# Patient Record
Sex: Female | Born: 1975 | Race: Black or African American | Hispanic: No | Smoking: Never smoker
Health system: Southern US, Community
[De-identification: ages and names within clinical notes are randomized; demographics above are authoritative.]

## PROBLEM LIST (undated history)

## (undated) DIAGNOSIS — R51 Headache: Secondary | ICD-10-CM

## (undated) DIAGNOSIS — Z460 Encounter for fitting and adjustment of spectacles and contact lenses: Secondary | ICD-10-CM

## (undated) DIAGNOSIS — R569 Unspecified convulsions: Secondary | ICD-10-CM

## (undated) DIAGNOSIS — Z9889 Other specified postprocedural states: Secondary | ICD-10-CM

## (undated) DIAGNOSIS — R519 Headache, unspecified: Secondary | ICD-10-CM

## (undated) DIAGNOSIS — Q613 Polycystic kidney, unspecified: Secondary | ICD-10-CM

## (undated) DIAGNOSIS — D649 Anemia, unspecified: Secondary | ICD-10-CM

## (undated) DIAGNOSIS — R011 Cardiac murmur, unspecified: Secondary | ICD-10-CM

## (undated) DIAGNOSIS — Z940 Kidney transplant status: Secondary | ICD-10-CM

## (undated) DIAGNOSIS — E210 Primary hyperparathyroidism: Secondary | ICD-10-CM

## (undated) DIAGNOSIS — I10 Essential (primary) hypertension: Secondary | ICD-10-CM

## (undated) DIAGNOSIS — R112 Nausea with vomiting, unspecified: Secondary | ICD-10-CM

## (undated) DIAGNOSIS — E871 Hypo-osmolality and hyponatremia: Secondary | ICD-10-CM

## (undated) HISTORY — PX: BUNIONECTOMY: SHX129

## (undated) HISTORY — PX: MOUTH SURGERY: SHX715

## (undated) HISTORY — PX: KIDNEY TRANSPLANT: SHX239

## (undated) HISTORY — PX: NEPHRECTOMY: SHX65

---

## 2003-06-04 ENCOUNTER — Other Ambulatory Visit: Admission: RE | Admit: 2003-06-04 | Discharge: 2003-06-04 | Payer: Self-pay | Admitting: Obstetrics and Gynecology

## 2003-08-28 ENCOUNTER — Encounter: Admission: RE | Admit: 2003-08-28 | Discharge: 2003-08-28 | Payer: Self-pay | Admitting: Family Medicine

## 2003-10-02 ENCOUNTER — Ambulatory Visit (HOSPITAL_COMMUNITY): Admission: RE | Admit: 2003-10-02 | Discharge: 2003-10-02 | Payer: Self-pay | Admitting: Nephrology

## 2005-07-08 ENCOUNTER — Ambulatory Visit: Payer: Self-pay | Admitting: Family Medicine

## 2006-04-12 ENCOUNTER — Ambulatory Visit: Payer: Self-pay | Admitting: Family Medicine

## 2006-04-12 LAB — CONVERTED CEMR LAB
ALT: 19 units/L (ref 0–40)
AST: 21 units/L (ref 0–37)
Albumin: 3.9 g/dL (ref 3.5–5.2)
Alkaline Phosphatase: 45 units/L (ref 39–117)
BUN: 14 mg/dL (ref 6–23)
Basophils Absolute: 0 10*3/uL (ref 0.0–0.1)
Basophils Relative: 0.3 % (ref 0.0–1.0)
CO2: 26 meq/L (ref 19–32)
Calcium: 9.3 mg/dL (ref 8.4–10.5)
Chloride: 98 meq/L (ref 96–112)
Creatinine, Ser: 0.8 mg/dL (ref 0.4–1.2)
Eosinophil percent: 1.6 % (ref 0.0–5.0)
Folate: >20 ng/mL
Free T4: 0.7 ng/dL (ref 0.6–1.6)
GFR calc non Af Amer: 89 mL/min
Glomerular Filtration Rate, Af Am: 108 mL/min/{1.73_m2}
Glucose, Bld: 101 mg/dL — ABNORMAL HIGH (ref 70–99)
HCT: 35.4 % — ABNORMAL LOW (ref 36.0–46.0)
Hemoglobin: 11.8 g/dL — ABNORMAL LOW (ref 12.0–15.0)
Lymphocytes Relative: 26.3 % (ref 12.0–46.0)
MCHC: 33.2 g/dL (ref 30.0–36.0)
MCV: 94 fL (ref 78.0–100.0)
Monocytes Absolute: 0.4 10*3/uL (ref 0.2–0.7)
Monocytes Relative: 8.2 % (ref 3.0–11.0)
Neutro Abs: 3 10*3/uL (ref 1.4–7.7)
Neutrophils Relative %: 63.6 % (ref 43.0–77.0)
Platelets: 234 10*3/uL (ref 150–400)
Potassium: 3.4 meq/L — ABNORMAL LOW (ref 3.5–5.1)
RBC: 3.76 M/uL — ABNORMAL LOW (ref 3.87–5.11)
RDW: 12.3 % (ref 11.5–14.6)
Sodium: 133 meq/L — ABNORMAL LOW (ref 135–145)
T3, Free: 2.6 pg/mL (ref 2.3–4.2)
TSH: 1.07 microintl units/mL (ref 0.35–5.50)
Total Bilirubin: 0.6 mg/dL (ref 0.3–1.2)
Total Protein: 6.8 g/dL (ref 6.0–8.3)
Vitamin B-12: 817 pg/mL (ref 211–911)
WBC: 4.7 10*3/uL (ref 4.5–10.5)

## 2006-11-03 ENCOUNTER — Encounter: Payer: Self-pay | Admitting: Family Medicine

## 2007-01-02 ENCOUNTER — Inpatient Hospital Stay (HOSPITAL_COMMUNITY): Admission: AD | Admit: 2007-01-02 | Discharge: 2007-01-05 | Payer: Self-pay | Admitting: Obstetrics and Gynecology

## 2007-01-03 ENCOUNTER — Encounter (INDEPENDENT_AMBULATORY_CARE_PROVIDER_SITE_OTHER): Payer: Self-pay | Admitting: Obstetrics and Gynecology

## 2007-02-02 ENCOUNTER — Encounter: Payer: Self-pay | Admitting: Family Medicine

## 2007-02-21 ENCOUNTER — Encounter: Admission: RE | Admit: 2007-02-21 | Discharge: 2007-02-21 | Payer: Self-pay | Admitting: Nephrology

## 2007-07-31 ENCOUNTER — Encounter: Payer: Self-pay | Admitting: Family Medicine

## 2008-03-07 ENCOUNTER — Encounter: Payer: Self-pay | Admitting: Family Medicine

## 2008-05-22 ENCOUNTER — Ambulatory Visit: Payer: Self-pay | Admitting: Family Medicine

## 2008-05-22 ENCOUNTER — Telehealth (INDEPENDENT_AMBULATORY_CARE_PROVIDER_SITE_OTHER): Payer: Self-pay | Admitting: *Deleted

## 2008-05-22 DIAGNOSIS — J019 Acute sinusitis, unspecified: Secondary | ICD-10-CM | POA: Insufficient documentation

## 2008-09-26 ENCOUNTER — Encounter: Payer: Self-pay | Admitting: Family Medicine

## 2009-04-14 ENCOUNTER — Encounter: Payer: Self-pay | Admitting: Family Medicine

## 2009-10-20 ENCOUNTER — Encounter: Payer: Self-pay | Admitting: Family Medicine

## 2010-02-23 ENCOUNTER — Encounter: Payer: Self-pay | Admitting: Family Medicine

## 2010-03-02 ENCOUNTER — Encounter: Payer: Self-pay | Admitting: Family Medicine

## 2010-05-26 NOTE — Letter (Signed)
Summary: [facility]   Imported By: Edmonia James 05/26/2009 10:50:40  _____________________________________________________________________  External Attachment:    Type:   Image     Comment:   External Document  Appended Document: [facility]    Clinical Lists Changes  Observations: Added new observation of SOCIAL HX: Religion affecting care-- Jehovah witness  (05/26/2009 13:08) Added new observation of RELIGIONIMPT: Religion affecting care (05/26/2009 13:08)          Social History: Religion affecting care-- Jehovah witness

## 2010-05-26 NOTE — Letter (Signed)
Summary: External Correspondence--[facility]  External Correspondence--[facility]   Imported By: Velora Heckler 11/28/2006 11:43:47  _____________________________________________________________________  External Attachment:    Type:   Image     Comment:   External Document

## 2010-05-26 NOTE — Letter (Signed)
Summary: [facility]   Imported By: Edmonia James 11/04/2009 10:04:38  _____________________________________________________________________  External Attachment:    Type:   Image     Comment:   External Document

## 2010-05-26 NOTE — Letter (Signed)
Summary: [facility]  [facility]   Imported By: Velora Heckler 03/24/2007 10:57:40  _____________________________________________________________________  External Attachment:    Type:   Image     Comment:   External Document

## 2010-05-26 NOTE — Progress Notes (Signed)
Summary: diff med - dr Etter Sjogren  Phone Note Call from Patient Call back at Work Phone [phone]   Caller: Patient Summary of Call: patient was given rx for biaxin & Sharlene Dory -she wants to know if there is something less expensive that can be called in   [place] [facility]  Initial call taken by: Carol Spring,  May 22, 2008 1:15 PM  Follow-up for Phone Call        dr lowne pls advise.Felecia Deloach CMA  May 22, 2008 2:03 PM  Additional Follow-up for Phone Call Additional follow up Details #1::        which one?  biaxin is generic.\par Additional Follow-up by: Garnet Koyanagi DO,  May 22, 2008 4:35 PM    Additional Follow-up for Phone Call Additional follow up Details #2::    cough med---- robitussin ac  6 oz 1 tsp by mouth at bedtime as needed  Follow-up by: Garnet Koyanagi DO,  May 23, 2008 12:12 PM  Additional Follow-up for Phone Call Additional follow up Details #3:: Details for Additional Follow-up Action Taken: ledt msg at pt home # rx faxe to [facility] Additional Follow-up by: Verdie Mosher,  May 23, 2008 5:18 PM  New/Updated Medications: ROBITUSSIN COUGH/COLD LONG-ACT 2-15 MG/5ML LIQD (CHLORPHENIRAMINE-DM) 1 tsp by mouth at bedtime   Prescriptions: ROBITUSSIN COUGH/COLD LONG-ACT 2-15 MG/5ML LIQD (CHLORPHENIRAMINE-DM) 1 tsp by mouth at bedtime  #6 oz x 0   Entered by:   Verdie Mosher   Authorized by:   Garnet Koyanagi DO   Signed by:   Verdie Mosher on 05/23/2008   Method used:   Faxed to ...       [facility].* (retail)       [phone]       [place], [place]  [address]       Ph: [phone]       Fax: [phone]   RxID:   [id number]

## 2010-05-26 NOTE — Letter (Signed)
Summary: [facility]   Imported By: Edmonia James 03/23/2010 12:23:55  _____________________________________________________________________  External Attachment:    Type:   Image     Comment:   External Document

## 2010-05-26 NOTE — Letter (Signed)
Summary: [facility]   Imported By: Velora Heckler 08/15/2007 11:57:31  _____________________________________________________________________  External Attachment:    Type:   Image     Comment:   External Document

## 2010-05-26 NOTE — Letter (Signed)
Summary: [facility]   Imported By: Edmonia James 10/16/2008 09:07:45  _____________________________________________________________________  External Attachment:    Type:   Image     Comment:   External Document

## 2010-05-26 NOTE — Assessment & Plan Note (Signed)
Summary: acute congested.cbs   Vital Signs:  Patient Profile:   35 Years Old Female Weight:      152.8 pounds O2 Sat:      100 % Temp:     98.3 degrees F oral Pulse rate:   68 / minute BP sitting:   120 / 82  (left arm)  Pt. in pain?   no  Vitals Entered By: Rolla Flatten CMA (May 22, 2008 11:47 AM)                  Chief Complaint:  cough, vomiting, runny, stuffy nose, and URI symptoms.  History of Present Illness:  URI Symptoms      This is a 35 year old woman who presents with URI symptoms.  The symptoms began duration > 3 days ago.  Pt here c/o congestion and st since the 8th.  The patient reports nasal congestion, purulent nasal discharge, and productive cough, but denies clear nasal discharge, sore throat, dry cough, earache, and sick contacts.  The patient denies fever, low-grade fever (<100.5 degrees), fever of 100.5-103 degrees, fever of 103.1-104 degrees, fever to >104 degrees, stiff neck, dyspnea, wheezing, rash, vomiting, diarrhea, use of an antipyretic, and response to antipyretic.  The patient also reports headache.  The patient denies itchy watery eyes, itchy throat, sneezing, seasonal symptoms, response to antihistamine, muscle aches, and severe fatigue.  The patient denies the following risk factors for Strep sinusitis: unilateral facial pain, unilateral nasal discharge, poor response to decongestant, double sickening, tooth pain, Strep exposure, tender adenopathy, and absence of cough.  Pt tried Robitussin and coricidin with no relief.    Current Allergies (reviewed today): No known allergies      Review of Systems      See HPI   Physical Exam  General:     Well-developed,well-nourished,in no acute distress; alert,appropriate and cooperative throughout examination Ears:     + fluid b/Lno external deformities.   Nose:     L frontal sinus tenderness, L maxillary sinus tenderness, R frontal sinus tenderness, and R maxillary sinus tenderness.     Mouth:     Oral mucosa and oropharynx without lesions or exudates.  Teeth in good repair. Neck:     cervical lymphadenopathy.   Lungs:     Normal respiratory effort, chest expands symmetrically. Lungs are clear to auscultation, no crackles or wheezes. Heart:     normal rate and regular rhythm.   Psych:     Oriented X3, memory intact for recent and remote, and normally interactive.      Impression & Recommendations:  Problem # 1:  SINUSITIS- ACUTE-NOS ([id number])  Her updated medication list for this problem includes:    Biaxin Xl 500 Mg Xr24h-tab (Clarithromycin) .Marland Kitchen... 2 by mouth once daily    Tussionex [place] Er 8-10 Mg/69ml Lqcr (Chlorpheniramine-hydrocodone) .Marland Kitchen... 1 tsp by mouth at bedtime as needed    Nasacort Aq 55 Mcg/act Aers (Triamcinolone acetonide(nasal)) .Marland Kitchen... 2 sprays each nostril once daily Instructed on treatment. Call if symptoms persist or worsen.   Complete Medication List: 1)  Labetalol Hcl 100 Mg Tabs (Labetalol hcl) .... Take 1 tab two times a day 2)  Biaxin Xl 500 Mg Xr24h-tab (Clarithromycin) .... 2 by mouth once daily 3)  Tussionex [place]etic Er 8-10 Mg/55ml Lqcr (Chlorpheniramine-hydrocodone) .Marland Kitchen.. 1 tsp by mouth at bedtime as needed 4)  Nasacort Aq 55 Mcg/act Aers (Triamcinolone acetonide(nasal)) .... 2 sprays each nostril once daily    Prescriptions: NASACORT AQ 55 MCG/ACT AERS (  TRIAMCINOLONE ACETONIDE(NASAL)) 2 sprays each nostril once daily  #1 x 0   Entered and Authorized by:   Garnet Koyanagi DO   Signed by:   Garnet Koyanagi DO on 05/22/2008   Method used:   Print then Give to Patient   RxID:   [id number] [place] ER 8-10 MG/5ML LQCR (CHLORPHENIRAMINE-HYDROCODONE) 1 tsp by mouth at bedtime as needed  #6 oz x 0   Entered and Authorized by:   Garnet Koyanagi DO   Signed by:   Garnet Koyanagi DO on 05/22/2008   Method used:   Print then Give to Patient   RxID:   [id number] BIAXIN XL 500 MG XR24H-TAB (CLARITHROMYCIN) 2 by mouth  once daily  #28 x 0   Entered and Authorized by:   Garnet Koyanagi DO   Signed by:   Garnet Koyanagi DO on 05/22/2008   Method used:   Electronically to        [facility].* (retail)       [phone]       [place], [place]  [address]       Ph: [phone]       Fax: [phone]   RxID:   [id number]

## 2010-05-26 NOTE — Letter (Signed)
Summary: [facility]   Imported By: Edmonia James 03/25/2008 08:57:01  _____________________________________________________________________  External Attachment:    Type:   Image     Comment:   External Document

## 2010-06-18 ENCOUNTER — Encounter: Payer: Self-pay | Admitting: Family Medicine

## 2010-07-02 NOTE — Letter (Signed)
Summary: [facility]   Imported By: Laural Benes 06/26/2010 13:35:14  _____________________________________________________________________  External Attachment:    Type:   Image     Comment:   External Document

## 2011-02-05 LAB — CBC
HCT: 26.8 — ABNORMAL LOW
HCT: 34 — ABNORMAL LOW
Hemoglobin: 11.8 — ABNORMAL LOW
Hemoglobin: 9.5 — ABNORMAL LOW
MCHC: 34.5
MCHC: 35.3
MCV: 92.9
MCV: 94
Platelets: 166
Platelets: 230
RBC: 2.88 — ABNORMAL LOW
RBC: 3.62 — ABNORMAL LOW
RDW: 14.1 — ABNORMAL HIGH
RDW: 14.4 — ABNORMAL HIGH
WBC: 12.4 — ABNORMAL HIGH
WBC: 12.4 — ABNORMAL HIGH

## 2011-02-05 LAB — RPR: RPR Ser Ql: NONREACTIVE

## 2012-04-26 HISTORY — PX: OTHER SURGICAL HISTORY: SHX169

## 2012-09-28 ENCOUNTER — Encounter (HOSPITAL_COMMUNITY): Payer: Self-pay | Admitting: Pharmacy Technician

## 2012-10-04 ENCOUNTER — Encounter (HOSPITAL_COMMUNITY)
Admission: RE | Admit: 2012-10-04 | Discharge: 2012-10-04 | Disposition: A | Discharge status: Home / Self Care | Payer: 59 | Source: Ambulatory Visit | Attending: Obstetrics and Gynecology | Admitting: Obstetrics and Gynecology

## 2012-10-04 ENCOUNTER — Encounter (HOSPITAL_COMMUNITY): Payer: Self-pay

## 2012-10-04 DIAGNOSIS — Z01812 Encounter for preprocedural laboratory examination: Secondary | ICD-10-CM | POA: Insufficient documentation

## 2012-10-04 DIAGNOSIS — Z01818 Encounter for other preprocedural examination: Secondary | ICD-10-CM | POA: Insufficient documentation

## 2012-10-04 HISTORY — DX: Polycystic kidney, unspecified: Q61.3

## 2012-10-04 HISTORY — DX: Essential (primary) hypertension: I10

## 2012-10-04 LAB — CBC
HCT: 31.6 % — ABNORMAL LOW (ref 36.0–46.0)
Hemoglobin: 10.7 g/dL — ABNORMAL LOW (ref 12.0–15.0)
MCH: 30.7 pg (ref 26.0–34.0)
MCHC: 33.9 g/dL (ref 30.0–36.0)
MCV: 90.8 fL (ref 78.0–100.0)
Platelets: 181 10*3/uL (ref 150–400)
RBC: 3.48 MIL/uL — ABNORMAL LOW (ref 3.87–5.11)
RDW: 13 % (ref 11.5–15.5)
WBC: 3.5 10*3/uL — ABNORMAL LOW (ref 4.0–10.5)

## 2012-10-04 LAB — BASIC METABOLIC PANEL
BUN: 9 mg/dL (ref 6–23)
CO2: 25 mEq/L (ref 19–32)
Calcium: 9.4 mg/dL (ref 8.4–10.5)
Chloride: 104 mEq/L (ref 96–112)
Creatinine, Ser: 1.23 mg/dL — ABNORMAL HIGH (ref 0.50–1.10)
GFR calc Af Amer: 64 mL/min — ABNORMAL LOW (ref >90)
GFR calc non Af Amer: 55 mL/min — ABNORMAL LOW (ref >90)
Glucose, Bld: 84 mg/dL (ref 70–99)
Potassium: 3.9 mEq/L (ref 3.5–5.1)
Sodium: 138 mEq/L (ref 135–145)

## 2012-10-04 LAB — SURGICAL PCR SCREEN
MRSA, PCR: INVALID — AB
Staphylococcus aureus: INVALID — AB

## 2012-10-04 NOTE — Patient Instructions (Addendum)
[name]  10/04/2012   Your procedure is scheduled on:  10/12/12  Enter through the Main Entrance of [facility] up the phone at the desk and dial [phone].   Call this number if you have problems the morning of surgery: [phone]   Remember:   Do not eat food:After Midnight.  Do not drink clear liquids: 6 Hours before arrival.  Take these medicines the morning of surgery with A SIP OF WATER: NA   Do not wear jewelry, make-up or nail polish.  Do not wear lotions, powders, or perfumes. You may wear deodorant.  Do not shave 48 hours prior to surgery.  Do not bring valuables to the hospital.  [facility] is not responsible                  for any belongings or valuables brought to the hospital.  Contacts, dentures or bridgework may not be worn into surgery.  Leave suitcase in the car. After surgery it may be brought to your room.  For patients admitted to the hospital, checkout time is 11:00 AM the day of                discharge.   Patients discharged the day of surgery will not be allowed to drive                   home.  Name and phone number of your driver: NA  Special Instructions: Shower using CHG 2 nights before surgery and the night before surgery.  If you shower the day of surgery use CHG.  Use special wash - you have one bottle of CHG for all showers.  You should use approximately 1/3 of the bottle for each shower.   Please read over the following fact sheets that you were given: MRSA Information

## 2012-10-05 ENCOUNTER — Other Ambulatory Visit: Payer: Self-pay | Admitting: Obstetrics and Gynecology

## 2012-10-07 LAB — MRSA CULTURE

## 2012-10-23 ENCOUNTER — Encounter (HOSPITAL_COMMUNITY): Payer: Self-pay | Admitting: Anesthesiology

## 2012-10-23 ENCOUNTER — Ambulatory Visit (HOSPITAL_COMMUNITY)
Admission: RE | Admit: 2012-10-23 | Discharge: 2012-10-23 | Disposition: A | Discharge status: Home / Self Care | Payer: 59 | Source: Ambulatory Visit | Attending: Obstetrics and Gynecology | Admitting: Obstetrics and Gynecology

## 2012-10-23 ENCOUNTER — Encounter (HOSPITAL_COMMUNITY)
Admission: RE | Disposition: A | Discharge status: Home / Self Care | Payer: Self-pay | Source: Ambulatory Visit | Attending: Obstetrics and Gynecology

## 2012-10-23 DIAGNOSIS — N946 Dysmenorrhea, unspecified: Secondary | ICD-10-CM | POA: Insufficient documentation

## 2012-10-23 DIAGNOSIS — N920 Excessive and frequent menstruation with regular cycle: Secondary | ICD-10-CM | POA: Insufficient documentation

## 2012-10-23 DIAGNOSIS — N803 Endometriosis of pelvic peritoneum, unspecified: Secondary | ICD-10-CM

## 2012-10-23 HISTORY — PX: ROBOTIC ASSISTED LAPAROSCOPIC LYSIS OF ADHESION: SHX6080

## 2012-10-23 HISTORY — PX: HYSTEROSCOPY WITH D & C: SHX1775

## 2012-10-23 LAB — BASIC METABOLIC PANEL
BUN: 12 mg/dL (ref 6–23)
CO2: 27 mEq/L (ref 19–32)
Calcium: 8.8 mg/dL (ref 8.4–10.5)
Chloride: 101 mEq/L (ref 96–112)
Creatinine, Ser: 1.12 mg/dL — ABNORMAL HIGH (ref 0.50–1.10)
GFR calc Af Amer: 71 mL/min — ABNORMAL LOW (ref >90)
GFR calc non Af Amer: 61 mL/min — ABNORMAL LOW (ref >90)
Glucose, Bld: 85 mg/dL (ref 70–99)
Potassium: 3.6 mEq/L (ref 3.5–5.1)
Sodium: 137 mEq/L (ref 135–145)

## 2012-10-23 SURGERY — ROBOTIC ASSISTED LAPAROSCOPIC LYSIS OF ADHESION
Anesthesia: General | Site: Vagina | Wound class: Clean Contaminated

## 2012-10-23 MED ORDER — NEOSTIGMINE METHYLSULFATE 1 MG/ML IJ SOLN
INTRAMUSCULAR | Status: AC
  Filled 2012-10-23: qty 1

## 2012-10-23 MED ORDER — DEXAMETHASONE SODIUM PHOSPHATE 10 MG/ML IJ SOLN
INTRAMUSCULAR | Status: AC
  Filled 2012-10-23: qty 1

## 2012-10-23 MED ORDER — CHLOROPROCAINE HCL 1 % IJ SOLN
INTRAMUSCULAR | Status: DC | PRN
  Administered 2012-10-23: 20 mL

## 2012-10-23 MED ORDER — OXYCODONE-ACETAMINOPHEN 5-325 MG PO TABS: 1.0000 | ORAL_TABLET | ORAL | Status: DC | PRN

## 2012-10-23 MED ORDER — LIDOCAINE HCL (CARDIAC) 20 MG/ML IV SOLN
INTRAVENOUS | Status: AC
  Filled 2012-10-23: qty 5

## 2012-10-23 MED ORDER — GLYCOPYRROLATE 0.2 MG/ML IJ SOLN
INTRAMUSCULAR | Status: DC | PRN
  Administered 2012-10-23: 0.4 mg via INTRAVENOUS

## 2012-10-23 MED ORDER — ROPIVACAINE HCL 5 MG/ML IJ SOLN
INTRAMUSCULAR | Status: AC
  Filled 2012-10-23: qty 60

## 2012-10-23 MED ORDER — DEXAMETHASONE SODIUM PHOSPHATE 4 MG/ML IJ SOLN
INTRAMUSCULAR | Status: DC | PRN
  Administered 2012-10-23: 10 mg via INTRAVENOUS

## 2012-10-23 MED ORDER — MIDAZOLAM HCL 5 MG/5ML IJ SOLN
INTRAMUSCULAR | Status: DC | PRN
  Administered 2012-10-23: 2 mg via INTRAVENOUS

## 2012-10-23 MED ORDER — ONDANSETRON HCL 4 MG/2ML IJ SOLN
INTRAMUSCULAR | Status: DC | PRN
  Administered 2012-10-23: 4 mg via INTRAVENOUS

## 2012-10-23 MED ORDER — LIDOCAINE HCL (CARDIAC) 20 MG/ML IV SOLN
INTRAVENOUS | Status: DC | PRN
  Administered 2012-10-23: 50 mg via INTRAVENOUS

## 2012-10-23 MED ORDER — ACETAMINOPHEN 10 MG/ML IV SOLN
1000.0000 mg | Freq: Once | INTRAVENOUS | Status: AC
  Administered 2012-10-23: 1000 mg via INTRAVENOUS

## 2012-10-23 MED ORDER — OXYCODONE-ACETAMINOPHEN 5-325 MG PO TABS
1.0000 | ORAL_TABLET | Freq: Once | ORAL | Status: AC
  Administered 2012-10-23: 1 via ORAL

## 2012-10-23 MED ORDER — ONDANSETRON HCL 4 MG/2ML IJ SOLN
INTRAMUSCULAR | Status: AC
  Filled 2012-10-23: qty 2

## 2012-10-23 MED ORDER — ACETAMINOPHEN 10 MG/ML IV SOLN
INTRAVENOUS | Status: AC
  Filled 2012-10-23: qty 100

## 2012-10-23 MED ORDER — LACTATED RINGERS IV SOLN
INTRAVENOUS | Status: DC
  Administered 2012-10-23 (×3): via INTRAVENOUS

## 2012-10-23 MED ORDER — LACTATED RINGERS IR SOLN
Status: DC | PRN
  Administered 2012-10-23: 3000 mL

## 2012-10-23 MED ORDER — GLYCOPYRROLATE 0.2 MG/ML IJ SOLN
INTRAMUSCULAR | Status: AC
  Filled 2012-10-23: qty 2

## 2012-10-23 MED ORDER — BUPIVACAINE HCL (PF) 0.25 % IJ SOLN
INTRAMUSCULAR | Status: DC | PRN
  Administered 2012-10-23: 9 mL

## 2012-10-23 MED ORDER — FENTANYL CITRATE 0.05 MG/ML IJ SOLN
INTRAMUSCULAR | Status: DC | PRN
  Administered 2012-10-23: 50 ug via INTRAVENOUS
  Administered 2012-10-23: 150 ug via INTRAVENOUS
  Administered 2012-10-23: 50 ug via INTRAVENOUS

## 2012-10-23 MED ORDER — NEOSTIGMINE METHYLSULFATE 1 MG/ML IJ SOLN
INTRAMUSCULAR | Status: DC | PRN
  Administered 2012-10-23: 3 mg via INTRAVENOUS

## 2012-10-23 MED ORDER — FENTANYL CITRATE 0.05 MG/ML IJ SOLN
INTRAMUSCULAR | Status: AC
  Filled 2012-10-23: qty 5

## 2012-10-23 MED ORDER — PROPOFOL 10 MG/ML IV BOLUS
INTRAVENOUS | Status: DC | PRN
  Administered 2012-10-23: 100 mg via INTRAVENOUS

## 2012-10-23 MED ORDER — GLYCINE 1.5 % IR SOLN
Status: DC | PRN
  Administered 2012-10-23: 3000 mL

## 2012-10-23 MED ORDER — ROPIVACAINE HCL 5 MG/ML IJ SOLN
INTRAMUSCULAR | Status: DC | PRN
  Administered 2012-10-23: 60 mL

## 2012-10-23 MED ORDER — FENTANYL CITRATE 0.05 MG/ML IJ SOLN
25.0000 ug | INTRAMUSCULAR | Status: DC | PRN
  Administered 2012-10-23: 50 ug via INTRAVENOUS

## 2012-10-23 MED ORDER — ARTIFICIAL TEARS OP OINT
TOPICAL_OINTMENT | OPHTHALMIC | Status: AC
  Filled 2012-10-23: qty 3.5

## 2012-10-23 MED ORDER — OXYCODONE-ACETAMINOPHEN 5-325 MG PO TABS
ORAL_TABLET | ORAL | Status: AC
  Filled 2012-10-23: qty 1

## 2012-10-23 MED ORDER — BUPIVACAINE HCL (PF) 0.25 % IJ SOLN
INTRAMUSCULAR | Status: AC
  Filled 2012-10-23: qty 30

## 2012-10-23 MED ORDER — PROPOFOL 10 MG/ML IV EMUL
INTRAVENOUS | Status: AC
  Filled 2012-10-23: qty 20

## 2012-10-23 MED ORDER — CHLOROPROCAINE HCL 1 % IJ SOLN
INTRAMUSCULAR | Status: AC
  Filled 2012-10-23: qty 30

## 2012-10-23 MED ORDER — MIDAZOLAM HCL 2 MG/2ML IJ SOLN
INTRAMUSCULAR | Status: AC
  Filled 2012-10-23: qty 2

## 2012-10-23 MED ORDER — FENTANYL CITRATE 0.05 MG/ML IJ SOLN
INTRAMUSCULAR | Status: AC
  Administered 2012-10-23: 50 ug via INTRAVENOUS
  Filled 2012-10-23: qty 2

## 2012-10-23 MED ORDER — CISATRACURIUM BESYLATE (PF) 10 MG/5ML IV SOLN
INTRAVENOUS | Status: DC | PRN
  Administered 2012-10-23: 10 mg via INTRAVENOUS
  Administered 2012-10-23: 2 mg via INTRAVENOUS

## 2012-10-23 SURGICAL SUPPLY — 69 items
BAG URINE DRAINAGE (UROLOGICAL SUPPLIES) IMPLANT
BARRIER ADHS 3X4 INTERCEED (GAUZE/BANDAGES/DRESSINGS) ×4 IMPLANT
CANISTER SUCTION 2500CC (MISCELLANEOUS) ×4 IMPLANT
CATH FOLEY 3WAY  5CC 16FR (CATHETERS)
CATH FOLEY 3WAY 5CC 16FR (CATHETERS) IMPLANT
CATH ROBINSON RED A/P 16FR (CATHETERS) IMPLANT
CHLORAPREP W/TINT 26ML (MISCELLANEOUS) ×4 IMPLANT
CLOTH BEACON ORANGE TIMEOUT ST (SAFETY) ×4 IMPLANT
CONT PATH 16OZ SNAP LID 3702 (MISCELLANEOUS) ×4 IMPLANT
CONTAINER PREFILL 10% NBF 60ML (FORM) ×4 IMPLANT
COVER MAYO STAND STRL (DRAPES) ×4 IMPLANT
COVER TABLE BACK 60X90 (DRAPES) ×4 IMPLANT
COVER TIP SHEARS 8 DVNC (MISCELLANEOUS) ×3 IMPLANT
COVER TIP SHEARS 8MM DA VINCI (MISCELLANEOUS) ×1
DECANTER SPIKE VIAL GLASS SM (MISCELLANEOUS) ×8 IMPLANT
DERMABOND ADVANCED (GAUZE/BANDAGES/DRESSINGS) ×1
DERMABOND ADVANCED .7 DNX12 (GAUZE/BANDAGES/DRESSINGS) ×3 IMPLANT
DEVICE TROCAR PUNCTURE CLOSURE (ENDOMECHANICALS) IMPLANT
DRAPE HUG U DISPOSABLE (DRAPE) ×4 IMPLANT
DRAPE LG THREE QUARTER DISP (DRAPES) ×4 IMPLANT
DRAPE WARM FLUID 44X44 (DRAPE) ×4 IMPLANT
DRESSING TELFA 8X3 (GAUZE/BANDAGES/DRESSINGS) ×12 IMPLANT
ELECT REM PT RETURN 9FT ADLT (ELECTROSURGICAL) ×4
ELECTRODE REM PT RTRN 9FT ADLT (ELECTROSURGICAL) ×3 IMPLANT
ELECTRODE ROLLER VERSAPOINT (ELECTRODE) IMPLANT
ELECTRODE RT ANGLE VERSAPOINT (CUTTING LOOP) IMPLANT
EVACUATOR SMOKE 8.L (FILTER) ×4 IMPLANT
GAUZE VASELINE 3X9 (GAUZE/BANDAGES/DRESSINGS) IMPLANT
GLOVE BIO SURGEON STRL SZ 6.5 (GLOVE) ×4 IMPLANT
GLOVE BIOGEL PI IND STRL 7.0 (GLOVE) ×6 IMPLANT
GLOVE BIOGEL PI INDICATOR 7.0 (GLOVE) ×2
GOWN STRL REIN XL XLG (GOWN DISPOSABLE) ×32 IMPLANT
KIT ACCESSORY DA VINCI DISP (KITS) ×1
KIT ACCESSORY DVNC DISP (KITS) ×3 IMPLANT
LEGGING LITHOTOMY PAIR STRL (DRAPES) IMPLANT
LOOP ANGLED CUTTING 22FR (CUTTING LOOP) IMPLANT
NEEDLE INSUFFLATION 120MM (ENDOMECHANICALS) ×4 IMPLANT
NS IRRIG 1000ML POUR BTL (IV SOLUTION) IMPLANT
OCCLUDER COLPOPNEUMO (BALLOONS) IMPLANT
PACK HYSTEROSCOPY LF (CUSTOM PROCEDURE TRAY) ×4 IMPLANT
PACK LAVH (CUSTOM PROCEDURE TRAY) ×4 IMPLANT
PAD OB MATERNITY 4.3X12.25 (PERSONAL CARE ITEMS) ×4 IMPLANT
PAD PREP 24X48 CUFFED NSTRL (MISCELLANEOUS) ×8 IMPLANT
PLUG CATH AND CAP STER (CATHETERS) IMPLANT
PROTECTOR NERVE ULNAR (MISCELLANEOUS) ×8 IMPLANT
SCISSORS LAP 5X35 DISP (ENDOMECHANICALS) IMPLANT
SET CYSTO W/LG BORE CLAMP LF (SET/KITS/TRAYS/PACK) IMPLANT
SET IRRIG TUBING LAPAROSCOPIC (IRRIGATION / IRRIGATOR) ×4 IMPLANT
SOLUTION ELECTROLUBE (MISCELLANEOUS) ×4 IMPLANT
SUT VIC AB 0 CT1 27 (SUTURE) ×2
SUT VIC AB 0 CT1 27XBRD ANTBC (SUTURE) ×6 IMPLANT
SUT VICRYL 0 UR6 27IN ABS (SUTURE) ×4 IMPLANT
SUT VICRYL 4-0 PS2 18IN ABS (SUTURE) ×8 IMPLANT
SYR 50ML LL SCALE MARK (SYRINGE) ×4 IMPLANT
SYSTEM CONVERTIBLE TROCAR (TROCAR) IMPLANT
TIP UTERINE 5.1X6CM LAV DISP (MISCELLANEOUS) IMPLANT
TIP UTERINE 6.7X10CM GRN DISP (MISCELLANEOUS) IMPLANT
TIP UTERINE 6.7X6CM WHT DISP (MISCELLANEOUS) IMPLANT
TIP UTERINE 6.7X8CM BLUE DISP (MISCELLANEOUS) IMPLANT
TOWEL OR 17X24 6PK STRL BLUE (TOWEL DISPOSABLE) ×12 IMPLANT
TRAY FOLEY BAG SILVER LF 14FR (CATHETERS) ×4 IMPLANT
TROCAR 12M 150ML BLUNT (TROCAR) IMPLANT
TROCAR DISP BLADELESS 8 DVNC (TROCAR) ×3 IMPLANT
TROCAR DISP BLADELESS 8MM (TROCAR) ×1
TROCAR OPTI TIP 12M 100M (ENDOMECHANICALS) ×4 IMPLANT
TROCAR Z-THREAD 12X150 (TROCAR) ×4 IMPLANT
TUBING FILTER THERMOFLATOR (ELECTROSURGICAL) ×4 IMPLANT
WARMER LAPAROSCOPE (MISCELLANEOUS) ×4 IMPLANT
WATER STERILE IRR 1000ML POUR (IV SOLUTION) ×12 IMPLANT

## 2012-10-23 NOTE — Anesthesia Procedure Notes (Signed)
Procedure Name: Intubation Date/Time: 10/23/2012 12:33 PM Performed by: Jonna Munro Pre-anesthesia Checklist: Suction available, Emergency Drugs available, Timeout performed, Patient identified and Patient being monitored Patient Re-evaluated:Patient Re-evaluated prior to inductionOxygen Delivery Method: Circle system utilized Preoxygenation: Pre-oxygenation with 100% oxygen Intubation Type: IV induction Ventilation: Mask ventilation without difficulty Laryngoscope Size: Mac and 3 Grade View: Grade I Tube type: Oral Tube size: 7.0 mm Number of attempts: 1 Airway Equipment and Method: Stylet Placement Confirmation: ETT inserted through vocal cords under direct vision,  positive ETCO2,  CO2 detector and breath sounds checked- equal and bilateral Secured at: 22 cm Tube secured with: Tape Dental Injury: Teeth and Oropharynx as per pre-operative assessment

## 2012-10-23 NOTE — Transfer of Care (Signed)
Immediate Anesthesia Transfer of Care Note  Patient: Sharon [name]  Procedure(s) Performed: Procedure(s): ROBOTIC ASSISTED  excision OF stage 2 pelvic ENDOMETRIOSIS (N/A) DILATATION AND CURETTAGE / Diagnostic HYSTEROSCOPY (N/A)  Patient Location: PACU  Anesthesia Type:General  Level of Consciousness: awake, alert  and oriented  Airway & Oxygen Therapy: Patient Spontanous Breathing and Patient connected to nasal cannula oxygen  Post-op Assessment: Report given to PACU RN and Post -op Vital signs reviewed and stable  Post vital signs: Reviewed and stable  Complications: No apparent anesthesia complications

## 2012-10-23 NOTE — Anesthesia Preprocedure Evaluation (Signed)
Anesthesia Evaluation  Patient identified by MRN, date of birth, ID band Patient awake    Reviewed: Allergy & Precautions, H&P , NPO status , Patient's Chart, lab work & pertinent test results, reviewed documented beta blocker date and time   History of Anesthesia Complications Negative for: history of anesthetic complications  Airway Mallampati: I TM Distance: >3 FB Neck ROM: full    Dental  (+) Teeth Intact   Pulmonary neg pulmonary ROS,  breath sounds clear to auscultation  Pulmonary exam normal       Cardiovascular Exercise Tolerance: Good hypertension (lisinopril, takes at night, BP 159/85), On Medications Rhythm:regular Rate:Normal     Neuro/Psych negative neurological ROS  negative psych ROS   GI/Hepatic negative GI ROS, Neg liver ROS,   Endo/Other  negative endocrine ROS  Renal/GU Renal disease (polycystic kidney disease, good kidney function)  Female GU complaint     Musculoskeletal   Abdominal   Peds  Hematology negative hematology ROS (+)   Anesthesia Other Findings NO TORADOL - kidney disease  Reproductive/Obstetrics negative OB ROS                           Anesthesia Physical Anesthesia Plan  ASA: II  Anesthesia Plan: General ETT   Post-op Pain Management:    Induction:   Airway Management Planned:   Additional Equipment:   Intra-op Plan:   Post-operative Plan:   Informed Consent: I have reviewed the patients History and Physical, chart, labs and discussed the procedure including the risks, benefits and alternatives for the proposed anesthesia with the patient or authorized representative who has indicated his/her understanding and acceptance.   Dental Advisory Given  Plan Discussed with: CRNA and Surgeon  Anesthesia Plan Comments:         Anesthesia Quick Evaluation

## 2012-10-23 NOTE — Brief Op Note (Signed)
10/23/2012  3:09 PM  PATIENT:  Sharon [name]  37 y.o. female  PRE-OPERATIVE DIAGNOSIS:  Dysmenorrhea, Menorrhagia, Endometrial Mass    POST-OPERATIVE DIAGNOSIS:  Dysmenorrhea, Menorrhagia, Stage II pelvic endometriosis  PROCEDURE:  Procedure(s): ROBOTIC ASSISTED  excision OF stage 2 pelvic ENDOMETRIOSIS (N/A) DILATATION AND CURETTAGE / Diagnostic HYSTEROSCOPY (N/A)  SURGEON:  Surgeon(s) and Role:    * Marvene Staff, MD - Primary      PHYSICIAN ASSISTANT:   ASSISTANTS: Brien Few, MD   ANESTHESIA:   general and paracervical block FINDINGS; endometriosis post cul de sac( infilitrating) and superficial, right ovarian fossa, nl tubes and ovaries, RF uterus ,nl liver edge, uterus w/ small uterine fibroid EBL:  Total I/O In: 1400 [I.V.:1400] Out: 325 [Urine:300; Blood:25]  BLOOD ADMINISTERED:none  DRAINS: none   LOCAL MEDICATIONS USED:  MARCAINE    and OTHER robuvicaine  SPECIMEN:  Source of Specimen:  emc, post cul desac endometriosis, right ovarian fossa endometrosis  DISPOSITION OF SPECIMEN:  PATHOLOGY  COUNTS:  YES  TOURNIQUET:  * No tourniquets in log *  DICTATION: .Other Dictation: Dictation Number [id number]  PLAN OF CARE: Discharge to home after PACU  PATIENT DISPOSITION:  PACU - hemodynamically stable.   Delay start of Pharmacological VTE agent (>24hrs) due to surgical blood loss or risk of bleeding: no

## 2012-10-24 ENCOUNTER — Encounter (HOSPITAL_COMMUNITY): Payer: Self-pay | Admitting: Obstetrics and Gynecology

## 2012-10-24 NOTE — Anesthesia Postprocedure Evaluation (Signed)
  Anesthesia Post-op Note  Patient: Sharon [name]  Procedure(s) Performed: Procedure(s): ROBOTIC ASSISTED  excision OF stage 2 pelvic ENDOMETRIOSIS (N/A) DILATATION AND CURETTAGE / Diagnostic HYSTEROSCOPY (N/A) Patient is awake and responsive. Pain and nausea are reasonably well controlled. Vital signs are stable and clinically acceptable. Oxygen saturation is clinically acceptable. There are no apparent anesthetic complications at this time. Patient is ready for discharge.

## 2012-10-24 NOTE — Op Note (Signed)
NAMEOLUWATENIOLA, LUTMAN              ACCOUNT NO.:  [id number]  MEDICAL RECORD NO.:  [id number]  LOCATION:  [facility]                          FACILITY:  [facility]  PHYSICIAN:  Servando Salina, M.D.DATE OF BIRTH:  1975-06-05  DATE OF PROCEDURE:  10/23/2012 DATE OF DISCHARGE:  10/23/2012                              OPERATIVE REPORT   PREOPERATIVE DIAGNOSES:  Severe dysmenorrhea, endometrial mass, menorrhagia.  PROCEDURE:  Da Vinci robotic excision of stage II pelvic endometriosis, diagnostic hysteroscopy, dilation and curettage.  POSTOPERATIVE DIAGNOSES:  Stage II pelvic endometriosis, menorrhagia.  ANESTHESIA:  General.  SURGEON:  Servando Salina, MD.  ASSISTANT:  Lovenia Kim, MD., robotic surgery.  PROCEDURE IN DETAIL:  Under adequate general anesthesia, the patient was placed in the dorsal lithotomy position.  She was positioned for potential robotic surgery.  Examination under anesthesia revealed a retroverted, bulky uterus.  No adnexal masses could be appreciated.  The patient was sterilely prepped and draped in usual fashion.  Indwelling Foley catheter was sterilely placed.  A bivalve speculum was placed in vagina.  Single-tooth tenaculum was placed on the anterior lip of the cervix.  The cervix was parous.  The cervix was easily accepted with 27 Pratt dilator.  A glycine primed diagnostic hysteroscope was introduced into the uterine cavity without incident.  Both tubal ostia could be seen.  Other than slight fluffiness, no specific lesion was noted.  The hysteroscope was removed.  The cavity was then curetted with moderate amount of tissue.  Hysteroscope was reinserted.  No other lesions were noted.  At that point, an acorn cannula was introduced into the cervical os and attached tenaculum for manipulation of the uterus.  The bivalve speculum was then removed, I then went to the patient's abdomen where 0.25% Marcaine was injected infraumbilically.  An incision was then  made.  Veress needle was introduced and tested, 3 L of CO2 was insufflated.  A 12-mm disposable trocar with sleeve was introduced into the abdomen without incident.  The robotic camera port was then placed. Inspection initially revealed some endometriotic implant in the fundus. On raising the uterus, it was noted that there was endometriosis in the posterior cul-de-sac, which resulted in the decision to proceed with the robotic instruments.  An 8-mm robotic ports were placed in the right lower quadrant as well as the left lower quadrant.  A 5-mm assistant port was placed in the right upper quadrant. The robot was then docked, and arm #1  monopolar scissors and arm #2 PK dissector.  I then went to the surgical consult.  At the surgical consult, the pelvis was further inspected.  Both ureters were identified.  Both ovaries were noted to be normal, tubes were normal, uterus had some evidence of some small fibroids posteriorly.  There was superficial and deep infiltrating endometriosis particularly in the posterior cul-de-sac, the right ovarian fossa, and less so on the left ovarian fossa.  With careful dissection, the deep infiltrating lesions were then excised from the posterior cul-de-sac, the right ovarian fossa, and minute endometrial implants were then cauterized on the ovary.  Once the resections were done, the abdomen was irrigated and suctioned.  Interceed was placed overlying the raw surface  of the posterior cul-de-sac and ropivacaine was then local anesthesia was then injected in the pelvis.  The procedure was felt to be completed at that point.  The robotic instruments were removed.  The robot was undocked.  I then went back to the patient's bedside sterilely and continued to inspect the pelvis.  Good hemostasis noted.  At which time, the instruments were then removed under direct visualization.  The instruments in the vagina was also removed.  The abdomen was deflated and the  incisions were closed, 0 Vicryl figure-of-eight sutures at the fascia infraumbilically and the skin approximated with 4-0 Vicryl subcuticular stitches.  SPECIMENS:  Endometrial curettings, and posterior cul-de-sac endometriosis as right ovarian fossa endometriosis, all sent to Pathology.  ESTIMATED BLOOD LOSS:  30 mL.  INTRAOPERATIVE FLUIDS:  2 L. The patient tolerated the procedure well, was transferred to recovery in stable condition.     Servando Salina, M.D.     Celina/MEDQ  D:  10/23/2012  T:  10/24/2012  Job:  [id number]

## 2013-03-01 ENCOUNTER — Other Ambulatory Visit: Payer: Self-pay

## 2016-04-26 HISTORY — PX: HERNIA REPAIR: SHX51

## 2016-06-08 ENCOUNTER — Other Ambulatory Visit (HOSPITAL_COMMUNITY): Payer: Self-pay | Admitting: *Deleted

## 2016-06-09 ENCOUNTER — Encounter (HOSPITAL_COMMUNITY)
Admission: RE | Admit: 2016-06-09 | Discharge: 2016-06-09 | Disposition: A | Discharge status: Home / Self Care | Payer: 59 | Source: Ambulatory Visit | Attending: Nephrology | Admitting: Nephrology

## 2016-06-09 DIAGNOSIS — D631 Anemia in chronic kidney disease: Secondary | ICD-10-CM | POA: Insufficient documentation

## 2016-06-09 DIAGNOSIS — N184 Chronic kidney disease, stage 4 (severe): Secondary | ICD-10-CM | POA: Diagnosis not present

## 2016-06-09 LAB — POCT HEMOGLOBIN-HEMACUE: Hemoglobin: 9.6 g/dL — ABNORMAL LOW (ref 12.0–15.0)

## 2016-06-09 MED ORDER — EPOETIN ALFA 20000 UNIT/ML IJ SOLN: 30000.0000 [IU] | INTRAMUSCULAR | Status: DC

## 2016-06-09 MED ORDER — EPOETIN ALFA 20000 UNIT/ML IJ SOLN
INTRAMUSCULAR | Status: AC
  Administered 2016-06-09: 20000 [IU] via SUBCUTANEOUS
  Filled 2016-06-09: qty 1

## 2016-06-09 MED ORDER — EPOETIN ALFA 10000 UNIT/ML IJ SOLN
INTRAMUSCULAR | Status: AC
  Administered 2016-06-09: 10000 [IU] via SUBCUTANEOUS
  Filled 2016-06-09: qty 1

## 2016-06-09 NOTE — Discharge Instructions (Signed)
Epoetin Alfa injection °What is this medicine? °EPOETIN ALFA (e POE e tin AL fa) helps your body make more red blood cells. This medicine is used to treat anemia caused by chronic kidney failure, cancer chemotherapy, or HIV-therapy. It may also be used before surgery if you have anemia. °This medicine may be used for other purposes; ask your health care provider or pharmacist if you have questions. °COMMON BRAND NAME(S): Epogen, Procrit °What should I tell my health care provider before I take this medicine? °They need to know if you have any of these conditions: °-blood clotting disorders °-cancer patient not on chemotherapy °-cystic fibrosis °-heart disease, such as angina or heart failure °-hemoglobin level of 12 g/dL or greater °-high blood pressure °-low levels of folate, iron, or vitamin B12 °-seizures °-an unusual or allergic reaction to erythropoietin, albumin, benzyl alcohol, hamster proteins, other medicines, foods, dyes, or preservatives °-pregnant or trying to get pregnant °-breast-feeding °How should I use this medicine? °This medicine is for injection into a vein or under the skin. It is usually given by a health care professional in a hospital or clinic setting. °If you get this medicine at home, you will be taught how to prepare and give this medicine. Use exactly as directed. Take your medicine at regular intervals. Do not take your medicine more often than directed. °It is important that you put your used needles and syringes in a special sharps container. Do not put them in a trash can. If you do not have a sharps container, call your pharmacist or healthcare provider to get one. °A special MedGuide will be given to you by the pharmacist with each prescription and refill. Be sure to read this information carefully each time. °Talk to your pediatrician regarding the use of this medicine in children. While this drug may be prescribed for selected conditions, precautions do apply. °Overdosage: If you  think you have taken too much of this medicine contact a poison control center or emergency room at once. °NOTE: This medicine is only for you. Do not share this medicine with others. °What if I miss a dose? °If you miss a dose, take it as soon as you can. If it is almost time for your next dose, take only that dose. Do not take double or extra doses. °What may interact with this medicine? °Do not take this medicine with any of the following medications: °-darbepoetin alfa °This list may not describe all possible interactions. Give your health care provider a list of all the medicines, herbs, non-prescription drugs, or dietary supplements you use. Also tell them if you smoke, drink alcohol, or use illegal drugs. Some items may interact with your medicine. °What should I watch for while using this medicine? °Your condition will be monitored carefully while you are receiving this medicine. °You may need blood work done while you are taking this medicine. °What side effects may I notice from receiving this medicine? °Side effects that you should report to your doctor or health care professional as soon as possible: °-allergic reactions like skin rash, itching or hives, swelling of the face, lips, or tongue °-breathing problems °-changes in vision °-chest pain °-confusion, trouble speaking or understanding °-feeling faint or lightheaded, falls °-high blood pressure °-muscle aches or pains °-pain, swelling, warmth in the leg °-rapid weight gain °-severe headaches °-sudden numbness or weakness of the face, arm or leg °-trouble walking, dizziness, loss of balance or coordination °-seizures (convulsions) °-swelling of the ankles, feet, hands °-unusually weak or tired °  Side effects that usually do not require medical attention (report to your doctor or health care professional if they continue or are bothersome): °-diarrhea °-fever, chills (flu-like symptoms) °-headaches °-nausea, vomiting °-redness, stinging, or swelling at  site where injected °This list may not describe all possible side effects. Call your doctor for medical advice about side effects. You may report side effects to FDA at 1-800-FDA-1088. °Where should I keep my medicine? °Keep out of the reach of children. °Store in a refrigerator between 2 and 8 degrees C (36 and 46 degrees F). Do not freeze or shake. Throw away any unused portion if using a single-dose vial. Multi-dose vials can be kept in the refrigerator for up to 21 days after the initial dose. Throw away unused medicine. °NOTE: This sheet is a summary. It may not cover all possible information. If you have questions about this medicine, talk to your doctor, pharmacist, or health care provider. °© 2017 [employer]vier/Gold Standard (2015-12-01 19:42:31) ° °

## 2016-06-24 ENCOUNTER — Encounter (HOSPITAL_COMMUNITY): Payer: PRIVATE HEALTH INSURANCE

## 2016-07-01 ENCOUNTER — Encounter (HOSPITAL_COMMUNITY)
Admission: RE | Admit: 2016-07-01 | Discharge: 2016-07-01 | Disposition: A | Discharge status: Home / Self Care | Payer: 59 | Source: Ambulatory Visit | Attending: Nephrology | Admitting: Nephrology

## 2016-07-01 DIAGNOSIS — N184 Chronic kidney disease, stage 4 (severe): Secondary | ICD-10-CM | POA: Insufficient documentation

## 2016-07-01 DIAGNOSIS — D631 Anemia in chronic kidney disease: Secondary | ICD-10-CM | POA: Diagnosis not present

## 2016-07-01 LAB — POCT HEMOGLOBIN-HEMACUE: Hemoglobin: 10.2 g/dL — ABNORMAL LOW (ref 12.0–15.0)

## 2016-07-01 MED ORDER — EPOETIN ALFA 10000 UNIT/ML IJ SOLN
INTRAMUSCULAR | Status: AC
  Administered 2016-07-01: 10000 [IU] via SUBCUTANEOUS
  Filled 2016-07-01: qty 1

## 2016-07-01 MED ORDER — EPOETIN ALFA 20000 UNIT/ML IJ SOLN: 30000.0000 [IU] | INTRAMUSCULAR | Status: DC

## 2016-07-01 MED ORDER — EPOETIN ALFA 20000 UNIT/ML IJ SOLN
INTRAMUSCULAR | Status: AC
  Administered 2016-07-01: 20000 [IU] via SUBCUTANEOUS
  Filled 2016-07-01: qty 1

## 2016-07-21 ENCOUNTER — Other Ambulatory Visit (HOSPITAL_COMMUNITY): Payer: Self-pay | Admitting: *Deleted

## 2016-07-22 ENCOUNTER — Encounter (HOSPITAL_COMMUNITY)
Admission: RE | Admit: 2016-07-22 | Discharge: 2016-07-22 | Disposition: A | Discharge status: Home / Self Care | Payer: 59 | Source: Ambulatory Visit | Attending: Nephrology | Admitting: Nephrology

## 2016-07-22 DIAGNOSIS — N184 Chronic kidney disease, stage 4 (severe): Secondary | ICD-10-CM | POA: Diagnosis not present

## 2016-07-22 LAB — FERRITIN: Ferritin: 20 ng/mL (ref 11–307)

## 2016-07-22 LAB — IRON AND TIBC
Iron: 77 ug/dL (ref 28–170)
Saturation Ratios: 22 % (ref 10.4–31.8)
TIBC: 356 ug/dL (ref 250–450)
UIBC: 279 ug/dL

## 2016-07-22 LAB — POCT HEMOGLOBIN-HEMACUE: Hemoglobin: 10.2 g/dL — ABNORMAL LOW (ref 12.0–15.0)

## 2016-07-22 MED ORDER — EPOETIN ALFA 10000 UNIT/ML IJ SOLN: 30000.0000 [IU] | INTRAMUSCULAR | Status: DC

## 2016-07-22 MED ORDER — EPOETIN ALFA 10000 UNIT/ML IJ SOLN
INTRAMUSCULAR | Status: AC
  Administered 2016-07-22: 10000 [IU] via SUBCUTANEOUS
  Filled 2016-07-22: qty 1

## 2016-07-22 MED ORDER — EPOETIN ALFA 20000 UNIT/ML IJ SOLN
INTRAMUSCULAR | Status: AC
  Administered 2016-07-22: 20000 [IU] via SUBCUTANEOUS
  Filled 2016-07-22: qty 1

## 2016-07-26 ENCOUNTER — Encounter: Payer: Self-pay | Admitting: Nephrology

## 2016-07-26 DIAGNOSIS — Z862 Personal history of diseases of the blood and blood-forming organs and certain disorders involving the immune mechanism: Secondary | ICD-10-CM | POA: Insufficient documentation

## 2016-07-26 DIAGNOSIS — N189 Chronic kidney disease, unspecified: Secondary | ICD-10-CM | POA: Insufficient documentation

## 2016-08-05 ENCOUNTER — Encounter (HOSPITAL_COMMUNITY): Payer: PRIVATE HEALTH INSURANCE

## 2016-08-11 ENCOUNTER — Other Ambulatory Visit (HOSPITAL_COMMUNITY): Payer: Self-pay | Admitting: *Deleted

## 2016-08-12 ENCOUNTER — Encounter (HOSPITAL_COMMUNITY)
Admission: RE | Admit: 2016-08-12 | Discharge: 2016-08-12 | Disposition: A | Discharge status: Home / Self Care | Payer: 59 | Source: Ambulatory Visit | Attending: Nephrology | Admitting: Nephrology

## 2016-08-12 DIAGNOSIS — D631 Anemia in chronic kidney disease: Secondary | ICD-10-CM | POA: Diagnosis not present

## 2016-08-12 DIAGNOSIS — N184 Chronic kidney disease, stage 4 (severe): Secondary | ICD-10-CM | POA: Diagnosis present

## 2016-08-12 DIAGNOSIS — Z862 Personal history of diseases of the blood and blood-forming organs and certain disorders involving the immune mechanism: Secondary | ICD-10-CM

## 2016-08-12 DIAGNOSIS — N189 Chronic kidney disease, unspecified: Secondary | ICD-10-CM

## 2016-08-12 LAB — POCT HEMOGLOBIN-HEMACUE: Hemoglobin: 9.9 g/dL — ABNORMAL LOW (ref 12.0–15.0)

## 2016-08-12 MED ORDER — SODIUM CHLORIDE 0.9 % IV SOLN
510.0000 mg | Freq: Once | INTRAVENOUS | Status: AC
  Administered 2016-08-12: 510 mg via INTRAVENOUS
  Filled 2016-08-12: qty 17

## 2016-08-12 MED ORDER — EPOETIN ALFA 10000 UNIT/ML IJ SOLN
INTRAMUSCULAR | Status: AC
  Administered 2016-08-12: 10000 [IU]
  Filled 2016-08-12: qty 1

## 2016-08-12 MED ORDER — EPOETIN ALFA 40000 UNIT/ML IJ SOLN: 30000.0000 [IU] | INTRAMUSCULAR | Status: DC

## 2016-08-12 MED ORDER — EPOETIN ALFA 20000 UNIT/ML IJ SOLN
INTRAMUSCULAR | Status: AC
  Administered 2016-08-12: 20000 [IU]
  Filled 2016-08-12: qty 1

## 2016-09-03 ENCOUNTER — Encounter (HOSPITAL_COMMUNITY)
Admission: RE | Admit: 2016-09-03 | Discharge: 2016-09-03 | Disposition: A | Discharge status: Home / Self Care | Payer: 59 | Source: Ambulatory Visit | Attending: Nephrology | Admitting: Nephrology

## 2016-09-03 DIAGNOSIS — N184 Chronic kidney disease, stage 4 (severe): Secondary | ICD-10-CM | POA: Insufficient documentation

## 2016-09-03 DIAGNOSIS — D631 Anemia in chronic kidney disease: Secondary | ICD-10-CM | POA: Insufficient documentation

## 2016-09-03 DIAGNOSIS — Z862 Personal history of diseases of the blood and blood-forming organs and certain disorders involving the immune mechanism: Secondary | ICD-10-CM

## 2016-09-03 DIAGNOSIS — N189 Chronic kidney disease, unspecified: Secondary | ICD-10-CM

## 2016-09-03 LAB — IRON AND TIBC
Iron: 121 ug/dL (ref 28–170)
Saturation Ratios: 45 % — ABNORMAL HIGH (ref 10.4–31.8)
TIBC: 267 ug/dL (ref 250–450)
UIBC: 146 ug/dL

## 2016-09-03 LAB — POCT HEMOGLOBIN-HEMACUE: Hemoglobin: 10.9 g/dL — ABNORMAL LOW (ref 12.0–15.0)

## 2016-09-03 LAB — FERRITIN: Ferritin: 194 ng/mL (ref 11–307)

## 2016-09-03 MED ORDER — EPOETIN ALFA 40000 UNIT/ML IJ SOLN: 30000.0000 [IU] | INTRAMUSCULAR | Status: DC

## 2016-09-03 MED ORDER — EPOETIN ALFA 10000 UNIT/ML IJ SOLN
INTRAMUSCULAR | Status: AC
  Administered 2016-09-03: 10000 [IU] via SUBCUTANEOUS
  Filled 2016-09-03: qty 1

## 2016-09-03 MED ORDER — EPOETIN ALFA 20000 UNIT/ML IJ SOLN
INTRAMUSCULAR | Status: AC
  Administered 2016-09-03: 20000 [IU] via SUBCUTANEOUS
  Filled 2016-09-03: qty 1

## 2016-09-24 ENCOUNTER — Encounter (HOSPITAL_COMMUNITY)
Admission: RE | Admit: 2016-09-24 | Discharge: 2016-09-24 | Disposition: A | Discharge status: Home / Self Care | Payer: 59 | Source: Ambulatory Visit | Attending: Nephrology | Admitting: Nephrology

## 2016-09-24 DIAGNOSIS — N184 Chronic kidney disease, stage 4 (severe): Secondary | ICD-10-CM | POA: Insufficient documentation

## 2016-09-24 DIAGNOSIS — D631 Anemia in chronic kidney disease: Secondary | ICD-10-CM | POA: Diagnosis not present

## 2016-09-24 DIAGNOSIS — Z862 Personal history of diseases of the blood and blood-forming organs and certain disorders involving the immune mechanism: Secondary | ICD-10-CM

## 2016-09-24 DIAGNOSIS — N189 Chronic kidney disease, unspecified: Secondary | ICD-10-CM

## 2016-09-24 LAB — POCT HEMOGLOBIN-HEMACUE: Hemoglobin: 11.3 g/dL — ABNORMAL LOW (ref 12.0–15.0)

## 2016-09-24 MED ORDER — EPOETIN ALFA 20000 UNIT/ML IJ SOLN
INTRAMUSCULAR | Status: AC
  Administered 2016-09-24: 20000 [IU] via SUBCUTANEOUS
  Filled 2016-09-24: qty 1

## 2016-09-24 MED ORDER — EPOETIN ALFA 40000 UNIT/ML IJ SOLN: 30000.0000 [IU] | INTRAMUSCULAR | Status: DC

## 2016-09-24 MED ORDER — EPOETIN ALFA 10000 UNIT/ML IJ SOLN
INTRAMUSCULAR | Status: AC
  Administered 2016-09-24: 10000 [IU] via SUBCUTANEOUS
  Filled 2016-09-24: qty 1

## 2016-10-15 ENCOUNTER — Encounter (HOSPITAL_COMMUNITY)
Admission: RE | Admit: 2016-10-15 | Discharge: 2016-10-15 | Disposition: A | Discharge status: Home / Self Care | Payer: 59 | Source: Ambulatory Visit | Attending: Nephrology | Admitting: Nephrology

## 2016-10-15 DIAGNOSIS — N184 Chronic kidney disease, stage 4 (severe): Secondary | ICD-10-CM | POA: Diagnosis not present

## 2016-10-15 DIAGNOSIS — Z862 Personal history of diseases of the blood and blood-forming organs and certain disorders involving the immune mechanism: Secondary | ICD-10-CM

## 2016-10-15 DIAGNOSIS — N189 Chronic kidney disease, unspecified: Secondary | ICD-10-CM

## 2016-10-15 LAB — FERRITIN: Ferritin: 91 ng/mL (ref 11–307)

## 2016-10-15 LAB — POCT HEMOGLOBIN-HEMACUE: Hemoglobin: 11.9 g/dL — ABNORMAL LOW (ref 12.0–15.0)

## 2016-10-15 LAB — IRON AND TIBC
Iron: 98 ug/dL (ref 28–170)
Saturation Ratios: 32 % — ABNORMAL HIGH (ref 10.4–31.8)
TIBC: 304 ug/dL (ref 250–450)
UIBC: 206 ug/dL

## 2016-10-15 MED ORDER — EPOETIN ALFA 20000 UNIT/ML IJ SOLN
INTRAMUSCULAR | Status: AC
  Administered 2016-10-15: 20000 [IU] via SUBCUTANEOUS
  Filled 2016-10-15: qty 1

## 2016-10-15 MED ORDER — EPOETIN ALFA 10000 UNIT/ML IJ SOLN
INTRAMUSCULAR | Status: AC
  Administered 2016-10-15: 10000 [IU] via SUBCUTANEOUS
  Filled 2016-10-15: qty 1

## 2016-10-15 MED ORDER — EPOETIN ALFA 40000 UNIT/ML IJ SOLN: 30000.0000 [IU] | INTRAMUSCULAR | Status: DC

## 2016-11-05 ENCOUNTER — Encounter (HOSPITAL_COMMUNITY)
Admission: RE | Admit: 2016-11-05 | Discharge: 2016-11-05 | Disposition: A | Discharge status: Home / Self Care | Payer: 59 | Source: Ambulatory Visit | Attending: Nephrology | Admitting: Nephrology

## 2016-11-05 DIAGNOSIS — N189 Chronic kidney disease, unspecified: Secondary | ICD-10-CM

## 2016-11-05 DIAGNOSIS — N184 Chronic kidney disease, stage 4 (severe): Secondary | ICD-10-CM | POA: Insufficient documentation

## 2016-11-05 DIAGNOSIS — D631 Anemia in chronic kidney disease: Secondary | ICD-10-CM | POA: Diagnosis not present

## 2016-11-05 DIAGNOSIS — Z862 Personal history of diseases of the blood and blood-forming organs and certain disorders involving the immune mechanism: Secondary | ICD-10-CM

## 2016-11-05 LAB — POCT HEMOGLOBIN-HEMACUE: Hemoglobin: 11.6 g/dL — ABNORMAL LOW (ref 12.0–15.0)

## 2016-11-05 MED ORDER — EPOETIN ALFA 20000 UNIT/ML IJ SOLN
INTRAMUSCULAR | Status: AC
  Administered 2016-11-05: 20000 [IU] via SUBCUTANEOUS
  Filled 2016-11-05: qty 1

## 2016-11-05 MED ORDER — EPOETIN ALFA 40000 UNIT/ML IJ SOLN: 30000.0000 [IU] | INTRAMUSCULAR | Status: DC

## 2016-11-05 MED ORDER — EPOETIN ALFA 10000 UNIT/ML IJ SOLN
INTRAMUSCULAR | Status: AC
  Administered 2016-11-05: 10000 [IU] via SUBCUTANEOUS
  Filled 2016-11-05: qty 1

## 2016-11-29 ENCOUNTER — Encounter (HOSPITAL_COMMUNITY)
Admission: RE | Admit: 2016-11-29 | Discharge: 2016-11-29 | Disposition: A | Discharge status: Home / Self Care | Payer: 59 | Source: Ambulatory Visit | Attending: Nephrology | Admitting: Nephrology

## 2016-11-29 DIAGNOSIS — D631 Anemia in chronic kidney disease: Secondary | ICD-10-CM | POA: Insufficient documentation

## 2016-11-29 DIAGNOSIS — N184 Chronic kidney disease, stage 4 (severe): Secondary | ICD-10-CM | POA: Diagnosis not present

## 2016-11-29 DIAGNOSIS — Z862 Personal history of diseases of the blood and blood-forming organs and certain disorders involving the immune mechanism: Secondary | ICD-10-CM

## 2016-11-29 DIAGNOSIS — N189 Chronic kidney disease, unspecified: Secondary | ICD-10-CM

## 2016-11-29 LAB — IRON AND TIBC
Iron: 121 ug/dL (ref 28–170)
Saturation Ratios: 43 % — ABNORMAL HIGH (ref 10.4–31.8)
TIBC: 280 ug/dL (ref 250–450)
UIBC: 159 ug/dL

## 2016-11-29 LAB — FERRITIN: Ferritin: 79 ng/mL (ref 11–307)

## 2016-11-29 LAB — POCT HEMOGLOBIN-HEMACUE: Hemoglobin: 11.3 g/dL — ABNORMAL LOW (ref 12.0–15.0)

## 2016-11-29 MED ORDER — EPOETIN ALFA 20000 UNIT/ML IJ SOLN
INTRAMUSCULAR | Status: AC
  Administered 2016-11-29: 20000 [IU] via SUBCUTANEOUS
  Filled 2016-11-29: qty 1

## 2016-11-29 MED ORDER — EPOETIN ALFA 40000 UNIT/ML IJ SOLN: 30000.0000 [IU] | INTRAMUSCULAR | Status: DC

## 2016-11-29 MED ORDER — EPOETIN ALFA 10000 UNIT/ML IJ SOLN
INTRAMUSCULAR | Status: AC
  Administered 2016-11-29: 10000 [IU] via SUBCUTANEOUS
  Filled 2016-11-29: qty 1

## 2016-12-24 ENCOUNTER — Encounter (HOSPITAL_COMMUNITY): Payer: PRIVATE HEALTH INSURANCE

## 2017-08-30 ENCOUNTER — Other Ambulatory Visit (HOSPITAL_COMMUNITY): Payer: Self-pay | Admitting: *Deleted

## 2017-08-31 ENCOUNTER — Encounter (HOSPITAL_COMMUNITY)
Admission: RE | Admit: 2017-08-31 | Discharge: 2017-08-31 | Disposition: A | Discharge status: Home / Self Care | Payer: PRIVATE HEALTH INSURANCE | Source: Ambulatory Visit | Attending: Nephrology | Admitting: Nephrology

## 2017-08-31 DIAGNOSIS — N189 Chronic kidney disease, unspecified: Secondary | ICD-10-CM | POA: Insufficient documentation

## 2017-08-31 DIAGNOSIS — D631 Anemia in chronic kidney disease: Secondary | ICD-10-CM | POA: Insufficient documentation

## 2017-08-31 MED ORDER — SODIUM CHLORIDE 0.9 % IV SOLN
510.0000 mg | Freq: Once | INTRAVENOUS | Status: AC
  Administered 2017-08-31: 510 mg via INTRAVENOUS
  Filled 2017-08-31: qty 17

## 2017-11-18 ENCOUNTER — Other Ambulatory Visit: Payer: Self-pay

## 2017-11-18 DIAGNOSIS — N185 Chronic kidney disease, stage 5: Secondary | ICD-10-CM

## 2017-12-20 ENCOUNTER — Ambulatory Visit (INDEPENDENT_AMBULATORY_CARE_PROVIDER_SITE_OTHER)
Admission: RE | Admit: 2017-12-20 | Discharge: 2017-12-20 | Disposition: A | Discharge status: Home / Self Care | Payer: PRIVATE HEALTH INSURANCE | Source: Ambulatory Visit | Attending: Vascular Surgery | Admitting: Vascular Surgery

## 2017-12-20 ENCOUNTER — Encounter: Payer: Self-pay | Admitting: Vascular Surgery

## 2017-12-20 ENCOUNTER — Other Ambulatory Visit: Payer: Self-pay

## 2017-12-20 ENCOUNTER — Ambulatory Visit (INDEPENDENT_AMBULATORY_CARE_PROVIDER_SITE_OTHER): Payer: PRIVATE HEALTH INSURANCE | Admitting: Vascular Surgery

## 2017-12-20 ENCOUNTER — Ambulatory Visit (HOSPITAL_COMMUNITY)
Admission: RE | Admit: 2017-12-20 | Discharge: 2017-12-20 | Disposition: A | Discharge status: Home / Self Care | Payer: PRIVATE HEALTH INSURANCE | Source: Ambulatory Visit | Attending: Vascular Surgery | Admitting: Vascular Surgery

## 2017-12-20 VITALS — BP 125/79 | HR 72 | Temp 97.7°F | Resp 16 | Ht 68.0 in | Wt 164.0 lb

## 2017-12-20 DIAGNOSIS — N185 Chronic kidney disease, stage 5: Secondary | ICD-10-CM | POA: Diagnosis not present

## 2017-12-20 NOTE — Progress Notes (Signed)
[facility]ist of [facility]  Patient name: Sharon [name] MRN: [id number] DOB: 07-27-1975 Sex: female  REASON FOR CONSULT: Discuss access for hemodialysis  HPI: Sharon [name] is a 42 y.o. female, who is here for discussion of access options for hemodialysis.  She has polycystic kidney disease with renal insufficiency.  Is never been on dialysis and has not had a central venous catheter.  She does not have a pacemaker.  He is considering transplant and also peritoneal dialysis.  Is here to discuss options for hemodialysis back-up.  He is right-handed.  Past Medical History:  Diagnosis Date  . Hypertension   . Polycystic kidney disease, congenital    [name]. MD. w/visits 2-3 times yrly  . S/P bunionectomy    right    History reviewed. No pertinent family history.  SOCIAL HISTORY: Social History   Socioeconomic History  . Marital status: Divorced    Spouse name: Not on file  . Number of children: Not on file  . Years of education: Not on file  . Highest education level: Not on file  Occupational History  . Not on file  Social Needs  . Financial resource strain: Not on file  . Food insecurity:    Worry: Not on file    Inability: Not on file  . Transportation needs:    Medical: Not on file    Non-medical: Not on file  Tobacco Use  . Smoking status: Never Smoker  . Smokeless tobacco: Never Used  Substance and Sexual Activity  . Alcohol use: No  . Drug use: No  . Sexual activity: Not on file  Lifestyle  . Physical activity:    Days per week: Not on file    Minutes per session: Not on file  . Stress: Not on file  Relationships  . Social connections:    Talks on phone: Not on file    Gets together: Not on file    Attends religious service: Not on file    Active member of club or organization: Not on file    Attends meetings of clubs or organizations: Not on file    Relationship status: Not on file  .  Intimate partner violence:    Fear of current or ex partner: Not on file    Emotionally abused: Not on file    Physically abused: Not on file    Forced sexual activity: Not on file  Other Topics Concern  . Not on file  Social History Narrative  . Not on file    Allergies  Allergen Reactions  . Penicillins Anaphylaxis    Current Outpatient Medications  Medication Sig Dispense Refill  . calcitRIOL (ROCALTROL) 0.25 MCG capsule Take 0.25 mcg by mouth daily. Alternates one or two every other day    . docusate sodium (COLACE) 100 MG capsule Take 100 mg by mouth 2 (two) times daily as needed.     . hydrochlorothiazide (HYDRODIURIL) 25 MG tablet Take 25 mg by mouth daily.    . potassium chloride SA (K-DUR,KLOR-CON) 20 MEQ tablet Take 20 mEq by mouth once.    . sodium bicarbonate 650 MG tablet Take 1,300 mg by mouth 2 (two) times daily.    Marland Kitchen telmisartan (MICARDIS) 80 MG tablet Take 80 mg by mouth daily.    . Tolvaptan (JYNARQUE) 90 & 30 MG TBPK Take 90 mg by mouth daily. Takes 90 in am and 30 in pm--90/30    . epoetin alfa (EPOGEN,PROCRIT) 94174 UNIT/ML injection 30,000  Units every 21 ( twenty-one) days.     No current facility-administered medications for this visit.     REVIEW OF SYSTEMS:  [X]  denotes positive finding, [ ]  denotes negative finding Cardiac  Comments:  Chest pain or chest pressure:    Shortness of breath upon exertion:    Short of breath when lying flat:    Irregular heart rhythm:        Vascular    Pain in calf, thigh, or hip brought on by ambulation:    Pain in feet at night that wakes you up from your sleep:     Blood clot in your veins:    Leg swelling:         Pulmonary    Oxygen at home:    Productive cough:     Wheezing:         Neurologic    Sudden weakness in arms or legs:     Sudden numbness in arms or legs:     Sudden onset of difficulty speaking or slurred speech:    Temporary loss of vision in one eye:     Problems with dizziness:           Gastrointestinal    Blood in stool:     Vomited blood:         Genitourinary    Burning when urinating:     Blood in urine:        Psychiatric    Major depression:         Hematologic    Bleeding problems:    Problems with blood clotting too easily:        Skin    Rashes or ulcers:        Constitutional    Fever or chills:      PHYSICAL EXAM: Vitals:   12/20/17 1011  BP: 125/79  Pulse: 72  Resp: 16  Temp: 97.7 F (36.5 C)  TempSrc: Oral  SpO2: 100%  Weight: 164 lb (74.4 kg)  Height: 5\' 8"  (1.727 m)    GENERAL: The patient is a well-nourished female, in no acute distress. The vital signs are documented above. CARDIOVASCULAR: 2+ radial pulses bilaterally.  She does have visible bowl antecubital vein bilaterally.  Otherwise small surface veins. PULMONARY: There is good air exchange  ABDOMEN: Soft and non-tender  MUSCULOSKELETAL: There are no major deformities or cyanosis. NEUROLOGIC: No focal weakness or paresthesias are detected. SKIN: There are no ulcers or rashes noted. PSYCHIATRIC: The patient has a normal affect.  DATA:  Noninvasive studies reveal moderate size vein in the left antecubital space.  The vein above this is smaller in size.  Upper extremity arterial studies are normal  MEDICAL ISSUES: Had long discussion with patient regarding options for hemodialysis.  I discussed hemodialysis tunneled catheter, AV fistula and AV graft.  Does have a marginal left upper arm phallic vein for fistula creation.  Would not proceed with AV graft at this time since she is not near dialysis.  She had seen patients at [facility] on her transplant evaluation with a very large aneurysmal fistulas and is quite concerned regarding the appearance of this.  I did explain that this is not the most typical outcome with fistula but can happen.  Did discuss the option of fistula creation with left left upper arm fistula currently versus waiting more until time for need for  hemodialysis.  Plan the outpatient procedure for fistula and then expectant management to see if she has  adequate maturation.  She wishes to discuss this further with Dr. Jimmy Footman and will notify us if she wishes left upper arm fistula creation   Rosetta Posner, MD [facility] of [facility] Tel [phone] Pager [phone]

## 2018-01-13 ENCOUNTER — Other Ambulatory Visit: Payer: Self-pay | Admitting: *Deleted

## 2018-01-13 NOTE — Progress Notes (Signed)
Patient called to schedule surgery for HD access. 02/24/18 requested and scheduled. Instructed to arrive at [facility] admitting department at 8:00 am. NPO past MN night prior and must have a driver and caregiver for discharge. Expect a call and follow the detailed instructions received from the hospital pre-admission department for this surgery. Call if questions.

## 2018-02-23 ENCOUNTER — Other Ambulatory Visit: Payer: Self-pay

## 2018-02-23 ENCOUNTER — Encounter (HOSPITAL_COMMUNITY): Payer: Self-pay | Admitting: *Deleted

## 2018-02-23 NOTE — Progress Notes (Signed)
Pt denies SOB, chest pain, and being under the care of a cardiologist. Pt denies having a cardiac cath. Requested EKG tracing from [facility]. Pt made aware to stop taking vitamins, fish oil and herbal medications. Do not take any NSAIDs ie: Ibuprofen, Advil, Naproxen (Aleve), Motrin, BC and Goody Powder. Pt verbalized understanding of all pre-op instructions.

## 2018-02-24 ENCOUNTER — Encounter (HOSPITAL_COMMUNITY): Payer: Self-pay

## 2018-02-24 ENCOUNTER — Encounter (HOSPITAL_COMMUNITY)
Admission: RE | Disposition: A | Discharge status: Home / Self Care | Payer: Self-pay | Source: Ambulatory Visit | Attending: Vascular Surgery

## 2018-02-24 ENCOUNTER — Ambulatory Visit (HOSPITAL_COMMUNITY): Payer: PRIVATE HEALTH INSURANCE | Admitting: Certified Registered"

## 2018-02-24 ENCOUNTER — Telehealth: Payer: Self-pay | Admitting: Vascular Surgery

## 2018-02-24 ENCOUNTER — Ambulatory Visit (HOSPITAL_COMMUNITY)
Admission: RE | Admit: 2018-02-24 | Discharge: 2018-02-24 | Disposition: A | Discharge status: Home / Self Care | Payer: PRIVATE HEALTH INSURANCE | Source: Ambulatory Visit | Attending: Vascular Surgery | Admitting: Vascular Surgery

## 2018-02-24 DIAGNOSIS — N185 Chronic kidney disease, stage 5: Secondary | ICD-10-CM | POA: Insufficient documentation

## 2018-02-24 DIAGNOSIS — Z880 Allergy status to penicillin: Secondary | ICD-10-CM | POA: Diagnosis not present

## 2018-02-24 DIAGNOSIS — Q613 Polycystic kidney, unspecified: Secondary | ICD-10-CM | POA: Insufficient documentation

## 2018-02-24 DIAGNOSIS — I120 Hypertensive chronic kidney disease with stage 5 chronic kidney disease or end stage renal disease: Secondary | ICD-10-CM | POA: Insufficient documentation

## 2018-02-24 HISTORY — DX: Headache: R51

## 2018-02-24 HISTORY — DX: Headache, unspecified: R51.9

## 2018-02-24 HISTORY — DX: Anemia, unspecified: D64.9

## 2018-02-24 HISTORY — DX: Unspecified convulsions: R56.9

## 2018-02-24 HISTORY — DX: Encounter for fitting and adjustment of spectacles and contact lenses: Z46.0

## 2018-02-24 HISTORY — PX: AV FISTULA PLACEMENT: SHX1204

## 2018-02-24 LAB — POCT I-STAT 4, (NA,K, GLUC, HGB,HCT)
Glucose, Bld: 86 mg/dL (ref 70–99)
HCT: 29 % — ABNORMAL LOW (ref 36.0–46.0)
Hemoglobin: 9.9 g/dL — ABNORMAL LOW (ref 12.0–15.0)
Potassium: 3.8 mmol/L (ref 3.5–5.1)
Sodium: 139 mmol/L (ref 135–145)

## 2018-02-24 LAB — HCG, SERUM, QUALITATIVE: Preg, Serum: NEGATIVE

## 2018-02-24 SURGERY — ARTERIOVENOUS (AV) FISTULA CREATION
Anesthesia: Monitor Anesthesia Care | Site: Arm Upper | Laterality: Left

## 2018-02-24 MED ORDER — SODIUM CHLORIDE 0.9 % IV SOLN
INTRAVENOUS | Status: DC | PRN
  Administered 2018-02-24: 15 ug/min via INTRAVENOUS

## 2018-02-24 MED ORDER — 0.9 % SODIUM CHLORIDE (POUR BTL) OPTIME
TOPICAL | Status: DC | PRN
  Administered 2018-02-24: 1000 mL

## 2018-02-24 MED ORDER — HYDROMORPHONE HCL 1 MG/ML IJ SOLN: 0.2500 mg | INTRAMUSCULAR | Status: DC | PRN

## 2018-02-24 MED ORDER — LIDOCAINE-EPINEPHRINE 0.5 %-1:200000 IJ SOLN
INTRAMUSCULAR | Status: DC | PRN
  Administered 2018-02-24: 50 mL

## 2018-02-24 MED ORDER — PROPOFOL 500 MG/50ML IV EMUL
INTRAVENOUS | Status: DC | PRN
  Administered 2018-02-24: 100 ug/kg/min via INTRAVENOUS

## 2018-02-24 MED ORDER — LIDOCAINE 2% (20 MG/ML) 5 ML SYRINGE
INTRAMUSCULAR | Status: AC
  Filled 2018-02-24: qty 5

## 2018-02-24 MED ORDER — ONDANSETRON HCL 4 MG/2ML IJ SOLN
INTRAMUSCULAR | Status: AC
  Filled 2018-02-24: qty 2

## 2018-02-24 MED ORDER — SODIUM CHLORIDE 0.9 % IV SOLN
INTRAVENOUS | Status: DC
  Administered 2018-02-24: 09:00:00 via INTRAVENOUS

## 2018-02-24 MED ORDER — SODIUM CHLORIDE 0.9 % IV SOLN
INTRAVENOUS | Status: DC | PRN
  Administered 2018-02-24: 500 mL

## 2018-02-24 MED ORDER — OXYCODONE HCL 5 MG PO TABS: 5.0000 mg | ORAL_TABLET | Freq: Once | ORAL | Status: DC | PRN

## 2018-02-24 MED ORDER — DEXAMETHASONE SODIUM PHOSPHATE 10 MG/ML IJ SOLN
INTRAMUSCULAR | Status: AC
Start: 2018-02-24 — End: ?
  Filled 2018-02-24: qty 1

## 2018-02-24 MED ORDER — LIDOCAINE 2% (20 MG/ML) 5 ML SYRINGE
INTRAMUSCULAR | Status: DC | PRN
  Administered 2018-02-24: 30 mg via INTRAVENOUS

## 2018-02-24 MED ORDER — PROMETHAZINE HCL 25 MG/ML IJ SOLN: 6.2500 mg | INTRAMUSCULAR | Status: DC | PRN

## 2018-02-24 MED ORDER — PHENYLEPHRINE 40 MCG/ML (10ML) SYRINGE FOR IV PUSH (FOR BLOOD PRESSURE SUPPORT)
PREFILLED_SYRINGE | INTRAVENOUS | Status: AC
  Filled 2018-02-24: qty 10

## 2018-02-24 MED ORDER — MIDAZOLAM HCL 2 MG/2ML IJ SOLN
INTRAMUSCULAR | Status: AC
  Filled 2018-02-24: qty 2

## 2018-02-24 MED ORDER — LIDOCAINE-EPINEPHRINE 0.5 %-1:200000 IJ SOLN
INTRAMUSCULAR | Status: AC
  Filled 2018-02-24: qty 1

## 2018-02-24 MED ORDER — FENTANYL CITRATE (PF) 100 MCG/2ML IJ SOLN
INTRAMUSCULAR | Status: DC | PRN
  Administered 2018-02-24 (×2): 50 ug via INTRAVENOUS

## 2018-02-24 MED ORDER — MIDAZOLAM HCL 5 MG/5ML IJ SOLN
INTRAMUSCULAR | Status: DC | PRN
  Administered 2018-02-24: 2 mg via INTRAVENOUS

## 2018-02-24 MED ORDER — OXYCODONE-ACETAMINOPHEN 5-325 MG PO TABS: 1.0000 | ORAL_TABLET | Freq: Four times a day (QID) | ORAL | 0 refills | Status: DC | PRN

## 2018-02-24 MED ORDER — CHLORHEXIDINE GLUCONATE 4 % EX LIQD: 60.0000 mL | Freq: Once | CUTANEOUS | Status: DC

## 2018-02-24 MED ORDER — VANCOMYCIN HCL IN DEXTROSE 1-5 GM/200ML-% IV SOLN
1000.0000 mg | INTRAVENOUS | Status: AC
  Administered 2018-02-24: 1000 mg via INTRAVENOUS

## 2018-02-24 MED ORDER — SODIUM CHLORIDE 0.9 % IV SOLN
INTRAVENOUS | Status: AC
  Filled 2018-02-24: qty 1.2

## 2018-02-24 MED ORDER — PROPOFOL 10 MG/ML IV BOLUS
INTRAVENOUS | Status: AC
  Filled 2018-02-24: qty 20

## 2018-02-24 MED ORDER — FENTANYL CITRATE (PF) 250 MCG/5ML IJ SOLN
INTRAMUSCULAR | Status: AC
  Filled 2018-02-24: qty 5

## 2018-02-24 MED ORDER — OXYCODONE HCL 5 MG/5ML PO SOLN: 5.0000 mg | Freq: Once | ORAL | Status: DC | PRN

## 2018-02-24 SURGICAL SUPPLY — 31 items
ARMBAND PINK RESTRICT EXTREMIT (MISCELLANEOUS) ×2 IMPLANT
CANISTER SUCT 3000ML PPV (MISCELLANEOUS) ×2 IMPLANT
CANNULA VESSEL 3MM 2 BLNT TIP (CANNULA) ×2 IMPLANT
CLIP LIGATING EXTRA MED SLVR (CLIP) ×2 IMPLANT
CLIP LIGATING EXTRA SM BLUE (MISCELLANEOUS) ×2 IMPLANT
COVER PROBE W GEL 5X96 (DRAPES) ×2 IMPLANT
COVER WAND RF STERILE (DRAPES) ×2 IMPLANT
DECANTER SPIKE VIAL GLASS SM (MISCELLANEOUS) ×2 IMPLANT
DERMABOND ADVANCED (GAUZE/BANDAGES/DRESSINGS) ×1
DERMABOND ADVANCED .7 DNX12 (GAUZE/BANDAGES/DRESSINGS) ×1 IMPLANT
ELECT REM PT RETURN 9FT ADLT (ELECTROSURGICAL) ×2
ELECTRODE REM PT RTRN 9FT ADLT (ELECTROSURGICAL) ×1 IMPLANT
GLOVE BIO SURGEON STRL SZ 6.5 (GLOVE) ×4 IMPLANT
GLOVE BIO SURGEON STRL SZ7 (GLOVE) ×4 IMPLANT
GLOVE BIOGEL M 6.5 STRL (GLOVE) ×2 IMPLANT
GLOVE BIOGEL M 7.0 STRL (GLOVE) ×2 IMPLANT
GLOVE SS BIOGEL STRL SZ 7.5 (GLOVE) ×1 IMPLANT
GLOVE SUPERSENSE BIOGEL SZ 7.5 (GLOVE) ×1
GOWN STRL REUS W/ TWL LRG LVL3 (GOWN DISPOSABLE) ×3 IMPLANT
GOWN STRL REUS W/TWL LRG LVL3 (GOWN DISPOSABLE) ×3
KIT BASIN OR (CUSTOM PROCEDURE TRAY) ×2 IMPLANT
KIT TURNOVER KIT B (KITS) ×2 IMPLANT
NS IRRIG 1000ML POUR BTL (IV SOLUTION) ×2 IMPLANT
PACK CV ACCESS (CUSTOM PROCEDURE TRAY) ×2 IMPLANT
PAD ARMBOARD 7.5X6 YLW CONV (MISCELLANEOUS) ×4 IMPLANT
SUT PROLENE 6 0 CC (SUTURE) ×2 IMPLANT
SUT VIC AB 3-0 SH 27 (SUTURE) ×1
SUT VIC AB 3-0 SH 27X BRD (SUTURE) ×1 IMPLANT
TOWEL GREEN STERILE (TOWEL DISPOSABLE) ×2 IMPLANT
UNDERPAD 30X30 (UNDERPADS AND DIAPERS) ×2 IMPLANT
WATER STERILE IRR 1000ML POUR (IV SOLUTION) ×2 IMPLANT

## 2018-02-24 NOTE — Anesthesia Procedure Notes (Signed)
Procedure Name: MAC Performed by: Eligha Bridegroom, CRNA Pre-anesthesia Checklist: Emergency Drugs available and Suction available Patient Re-evaluated:Patient Re-evaluated prior to induction Oxygen Delivery Method: Nasal cannula Preoxygenation: Pre-oxygenation with 100% oxygen Induction Type: IV induction

## 2018-02-24 NOTE — Op Note (Signed)
    OPERATIVE REPORT  DATE OF SURGERY: 02/24/2018  PATIENT: Sharon [name], 42 y.o. female MRN: [id number]  DOB: [birth year]  PRE-OPERATIVE DIAGNOSIS: Chronic renal insufficiency  POST-OPERATIVE DIAGNOSIS:  Same  PROCEDURE: Left brachiocephalic AV fistula creation  SURGEON:  Curt Jews, M.D.  PHYSICIAN ASSISTANT: Liana Crocker, PA-C  ANESTHESIA: Local with sedation  EBL: per anesthesia record  Total I/O In: 350 [I.V.:350] Out: 5 [Blood:5]  BLOOD ADMINISTERED: none  DRAINS: none  SPECIMEN: none  COUNTS CORRECT:  YES  PATIENT DISPOSITION:  PACU - hemodynamically stable  PROCEDURE DETAILS: The patient was taken to room placed supine position with area the left arm was prepped draped you sterile fashion.  SonoSite ultrasound was used to visualize the veins.  The patient did have a moderate size cephalic vein at the antecubital space extending onto the upper arm.  Using local anesthesia incision was made over the antecubital space and carried down to isolate the cephalic vein.  Tributary branches were ligated and divided and the vein was mobilized proximally and distally.  The vein was ligated distally and divided and was brought into approximation with the brachial artery.  The patient had a small to moderate-sized brachial artery.  The artery was occluded proximally and distally and a small arteriotomy was created.  The vein was cut to the appropriate length and was spatulated and sewn end-to-side to the artery with a running 6-0 Prolene suture.  Prior to completion of the closure a 2 dilator was passed proximally in the artery.  The anastomosis completed and there was a good thrill in the vein.  The patient did maintain a left radial pulse.  The wounds irrigated with saline.  Hemostasis talus cautery.  Wounds were closed with 3-0 Vicryl in the subcutaneous and subcuticular tissue.  Sterile dressing was applied and the patient was transferred to the recovery room in stable  condition   Rosetta Posner, M.D., [facility] 02/24/2018 11:14 AM

## 2018-02-24 NOTE — Anesthesia Postprocedure Evaluation (Signed)
Anesthesia Post Note  Patient: Sharon [name]  Procedure(s) Performed: ARTERIOVENOUS (AV) FISTULA CREATION LEFT ARM (Left Arm Upper)     Patient location during evaluation: PACU Anesthesia Type: MAC Level of consciousness: awake and alert Pain management: pain level controlled Vital Signs Assessment: post-procedure vital signs reviewed and stable Respiratory status: spontaneous breathing, nonlabored ventilation, respiratory function stable and patient connected to nasal cannula oxygen Cardiovascular status: stable and blood pressure returned to baseline Postop Assessment: no apparent nausea or vomiting Anesthetic complications: no    Last Vitals:  Vitals:   02/24/18 1237 02/24/18 1244  BP: 127/90 129/80  Pulse: 71 62  Resp: 15 16  Temp: 36.6 C   SpO2: 100% 100%    Last Pain:  Vitals:   02/24/18 1223  TempSrc:   PainSc: 0-No pain                 Dorla Guizar P Keaden Gunnoe

## 2018-02-24 NOTE — Telephone Encounter (Signed)
-----   Message from Gabriel Earing, [place] sent at 02/24/2018 11:12 AM EDT ----- S/p left BC AVF 02/24/18.  F/u in 6 weeks in [place] [facility] with duplex.  Thanks

## 2018-02-24 NOTE — H&P (Signed)
Office Visit   Go to Cards  12/20/2017  [facility] -Judge Stall, Arvilla Meres, MD     Vascular Surgery         Chronic kidney disease, stage V [facility])     Dx         New Patient (Initial Visit)  Ckd stage 5, Eval for AVF, Wait on AVG. Ref by Dr. Jimmy Footman [phone]. ; Referred by Robyne Peers, MD     Reason for Visit           Additional Documentation     Vitals:       BP 125/79 (BP Location: Right Arm, Patient Position: Sitting, Cuff Size: Normal)        Pulse 72        Temp 97.7 F (36.5 C) (Oral)        Resp 16        Ht 5\' 8"  (1.727 m)        Wt 74.4 kg        SpO2 100%        BMI 24.94 kg/m        BSA 1.89 m               More Vitals      Flowsheets:        Anthropometrics,        Clinical Intake,        Healthcare Directives,        Vital Signs,        MEWS Score       Encounter Info:        Billing Info,        History,        Allergies,        Detailed Report             All Notes         Progress Notes by Rosetta Posner, MD at 12/20/2017 10:30 AM     Author: Rosetta Posner, MD Author Type: Physician Filed: 12/20/2017  1:21 PM  Note Status: Signed Cosign: Cosign Not Required Encounter Date: 12/20/2017  Editor: Rosetta Posner, MD (Physician)                                                untitled image        [facility]     Patient name: Sharon [name]       MRN: [id number]        DOB: 08-Mar-1976          Sex: female     REASON FOR CONSULT: Discuss access for hemodialysis     HPI:  Sharon [name] is a 42 y.o. female, who is here for discussion of access options for hemodialysis.  She has polycystic kidney disease with renal insufficiency.  Is never been on dialysis and has not  had a central venous catheter.  She does not have a pacemaker.  He is considering transplant and also peritoneal dialysis.  Is here to discuss options for hemodialysis back-up.  He is right-handed.          Past Medical History:    Diagnosis   Date    .   Hypertension        .   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   S/P bunionectomy            right          History reviewed. No pertinent family history.     SOCIAL HISTORY:   Social History             Socioeconomic History    .   Marital status:   Divorced            Spouse name:   Not on file    .   Number of children:   Not on file    .   Years of education:   Not on file    .   Highest education level:   Not on file    Occupational History    .   Not on file    Social Needs    .   Financial resource strain:   Not on file    .   Food insecurity:            Worry:   Not on file            Inability:   Not on file    .   Transportation needs:            Medical:   Not on file            Non-medical:   Not on file    Tobacco Use    .   Smoking status:   Never Smoker    .   Smokeless tobacco:   Never Used    Substance and Sexual Activity    .   Alcohol use:   No    .   Drug use:   No    .   Sexual activity:   Not on file    Lifestyle    .   Physical activity:            Days per week:   Not on file            Minutes per session:   Not on file    .   Stress:   Not on file    Relationships    .   Social connections:            Talks on phone:   Not on file            Gets together:   Not on file            Attends religious service:   Not on file            Active member of club or organization:   Not on file             Attends meetings of clubs or organizations:   Not on file            Relationship status:   Not on file    .   Intimate partner violence:            Fear of current or ex partner:  Not on file            Emotionally abused:   Not on file            Physically abused:   Not on file            Forced sexual activity:   Not on file    Other Topics   Concern    .   Not on file    Social History Narrative    .   Not on file               Allergies    Allergen   Reactions    .   Penicillins   Anaphylaxis                 Current Outpatient Medications    Medication   Sig   Dispense   Refill    .   calcitRIOL (ROCALTROL) 0.25 MCG capsule   Take 0.25 mcg by mouth daily. Alternates one or two every other day            .   docusate sodium (COLACE) 100 MG capsule   Take 100 mg by mouth 2 (two) times daily as needed.             .   hydrochlorothiazide (HYDRODIURIL) 25 MG tablet   Take 25 mg by mouth daily.            .   potassium chloride SA (K-DUR,KLOR-CON) 20 MEQ tablet   Take 20 mEq by mouth once.            .   sodium bicarbonate 650 MG tablet   Take 1,300 mg by mouth 2 (two) times daily.            Marland Kitchen   telmisartan (MICARDIS) 80 MG tablet   Take 80 mg by mouth daily.            .   Tolvaptan (JYNARQUE) 90 & 30 MG TBPK   Take 90 mg by mouth daily. Takes 90 in am and 30 in pm--90/30            .   epoetin alfa (EPOGEN,PROCRIT) 38101 UNIT/ML injection   30,000 Units every 21 ( twenty-one) days.                No current facility-administered medications for this visit.           REVIEW OF SYSTEMS:   [X]  denotes positive finding, [ ]  denotes negative finding   Cardiac       Comments:    Chest pain or chest pressure:            Shortness of breath upon exertion:            Short of breath  when lying flat:            Irregular heart rhythm:                         Vascular            Pain in calf, thigh, or hip brought on by ambulation:            Pain in feet at night that wakes you up from your sleep:             Blood clot in your veins:  Leg swelling:                          Pulmonary            Oxygen at home:            Productive cough:             Wheezing:                          Neurologic            Sudden weakness in arms or legs:             Sudden numbness in arms or legs:             Sudden onset of difficulty speaking or slurred speech:            Temporary loss of vision in one eye:             Problems with dizziness:                          Gastrointestinal            Blood in stool:             Vomited blood:                          Genitourinary            Burning when urinating:             Blood in urine:                         Psychiatric            Major depression:                          Hematologic            Bleeding problems:            Problems with blood clotting too easily:                         Skin            Rashes or ulcers:                         Constitutional            Fever or chills:                  PHYSICAL EXAM:      Vitals:        12/20/17 1011    BP:   125/79    Pulse:   72    Resp:   16    Temp:   97.7 F (36.5 C)    TempSrc:   Oral    SpO2:   100%    Weight:   164 lb (74.4 kg)    Height:   5\' 8"  (1.727 m)          GENERAL: The patient is a well-nourished female, in no acute distress. The vital signs are documented above.  CARDIOVASCULAR: 2+ radial pulses bilaterally.  She does have visible  bowl antecubital vein bilaterally.  Otherwise small surface veins.  PULMONARY: There is good air exchange   ABDOMEN: Soft and non-tender   MUSCULOSKELETAL: There are no major deformities or cyanosis.  NEUROLOGIC: No focal weakness or paresthesias are detected.  SKIN: There are no ulcers or rashes noted.  PSYCHIATRIC: The patient has a normal affect.     DATA:   Noninvasive studies reveal moderate size vein in the left antecubital space.  The vein above this is smaller in size.     Upper extremity arterial studies are normal     MEDICAL ISSUES:  Had long discussion with patient regarding options for hemodialysis.  I discussed hemodialysis tunneled catheter, AV fistula and AV graft.  Does have a marginal left upper arm phallic vein for fistula creation.  Would not proceed with AV graft at this time since she is not near dialysis.  She had seen patients at [facility] on her transplant evaluation with a very large aneurysmal fistulas and is quite concerned regarding the appearance of this.  I did explain that this is not the most typical outcome with fistula but can happen.  Did discuss the option of fistula creation with left left upper arm fistula currently versus waiting more until time for need for hemodialysis.  Plan the outpatient procedure for fistula and then expectant management to see if she has adequate maturation.  She wishes to discuss this further with Dr. Jimmy Footman and will notify us if she wishes left upper arm fistula creation        Rosetta Posner, MD [facility]  [facility] [phone]  Pager [phone]    Addendum:  The patient has been re-examined and re-evaluated.  The patient's history and physical has been reviewed and is unchanged.    Sharon [name] is a 42 y.o. female is being admitted with CHRONIC KIDNEY DISEASE FOR HEMODIALYSIS ACCESS. All the risks, benefits and other treatment options have been  discussed with the patient. The patient has consented to proceed with Procedure(s): ARTERIOVENOUS (AV) FISTULA CREATION LEFT ARM as a surgical intervention.  Sharon [name] 02/24/2018 9:26 AM [facility] Surgery

## 2018-02-24 NOTE — Transfer of Care (Signed)
Immediate Anesthesia Transfer of Care Note  Patient: Sharon [name]  Procedure(s) Performed: ARTERIOVENOUS (AV) FISTULA CREATION LEFT ARM (Left Arm Upper)  Patient Location: PACU  Anesthesia Type:MAC  Level of Consciousness: awake and alert   Airway & Oxygen Therapy: Patient Spontanous Breathing and Patient connected to nasal cannula oxygen  Post-op Assessment: Report given to RN and Post -op Vital signs reviewed and stable  Post vital signs: Reviewed and stable  Last Vitals:  Vitals Value Taken Time  BP    Temp    Pulse    Resp    SpO2      Last Pain:  Vitals:   02/24/18 0848  TempSrc:   PainSc: 0-No pain         Complications: No apparent anesthesia complications

## 2018-02-24 NOTE — Anesthesia Preprocedure Evaluation (Addendum)
Anesthesia Evaluation  Patient identified by MRN, date of birth, ID band Patient awake    Reviewed: Allergy & Precautions, NPO status , Patient's Chart, lab work & pertinent test results  Airway Mallampati: II  TM Distance: >3 FB Neck ROM: Full    Dental no notable dental hx.    Pulmonary neg pulmonary ROS,    Pulmonary exam normal breath sounds clear to auscultation       Cardiovascular hypertension, Pt. on medications Normal cardiovascular exam Rhythm:Regular Rate:Normal  ECG: SR rate 73   Neuro/Psych  Headaches, Seizures -, Well Controlled,  negative psych ROS   GI/Hepatic negative GI ROS, Neg liver ROS,   Endo/Other  negative endocrine ROS  Renal/GU ESRFRenal disease     Musculoskeletal negative musculoskeletal ROS (+)   Abdominal   Peds  Hematology  (+) anemia ,   Anesthesia Other Findings CHRONIC KIDNEY DISEASE FOR HEMODIALYSIS ACCESS  Reproductive/Obstetrics                            Anesthesia Physical Anesthesia Plan  ASA: III  Anesthesia Plan: MAC   Post-op Pain Management:    Induction: Intravenous  PONV Risk Score and Plan: 2 and Ondansetron, Dexamethasone, Treatment may vary due to age or medical condition and Propofol infusion  Airway Management Planned: Simple Face Mask  Additional Equipment:   Intra-op Plan:   Post-operative Plan:   Informed Consent: I have reviewed the patients History and Physical, chart, labs and discussed the procedure including the risks, benefits and alternatives for the proposed anesthesia with the patient or authorized representative who has indicated his/her understanding and acceptance.   Dental advisory given  Plan Discussed with: CRNA  Anesthesia Plan Comments:        Anesthesia Quick Evaluation

## 2018-02-24 NOTE — Discharge Instructions (Signed)
° °  [facility]  Discharge Instructions  AV Fistula or Graft Surgery for Dialysis Access  Please refer to the following instructions for your post-procedure care. Your surgeon or physician assistant will discuss any changes with you.  Activity  You may drive the day following your surgery, if you are comfortable and no longer taking prescription pain medication. Resume full activity as the soreness in your incision resolves.  Bathing/Showering  You may shower after you go home. Keep your incision dry for 48 hours. Do not soak in a bathtub, hot tub, or swim until the incision heals completely. You may not shower if you have a hemodialysis catheter.  Incision Care  Clean your incision with mild soap and water after 48 hours. Pat the area dry with a clean towel. You do not need a bandage unless otherwise instructed. Do not apply any ointments or creams to your incision. You may have skin glue on your incision. Do not peel it off. It will come off on its own in about one week. Your arm may swell a bit after surgery. To reduce swelling use pillows to elevate your arm so it is above your heart. Your doctor will tell you if you need to lightly wrap your arm with an ACE bandage.  Diet  Resume your normal diet. There are not special food restrictions following this procedure. In order to heal from your surgery, it is CRITICAL to get adequate nutrition. Your body requires vitamins, minerals, and protein. Vegetables are the best source of vitamins and minerals. Vegetables also provide the perfect balance of protein. Processed food has little nutritional value, so try to avoid this.  Medications  Resume taking all of your medications. If your incision is causing pain, you may take over-the counter pain relievers such as acetaminophen (Tylenol). If you were prescribed a stronger pain medication, please be aware these medications can cause nausea and constipation. Prevent  nausea by taking the medication with a snack or meal. Avoid constipation by drinking plenty of fluids and eating foods with high amount of fiber, such as fruits, vegetables, and grains.  Do not take Tylenol if you are taking prescription pain medications.  Follow up Your surgeon may want to see you in the office following your access surgery. If so, this will be arranged at the time of your surgery.  Please call us immediately for any of the following conditions:  Increased pain, redness, drainage (pus) from your incision site Fever of 101 degrees or higher Severe or worsening pain at your incision site Hand pain or numbness.  Reduce your risk of vascular disease:  Stop smoking. If you would like help, call [facility] at 1-800-QUIT-NOW (854)645-6497) or [place] at [place] your cholesterol Maintain a desired weight Control your diabetes Keep your blood pressure down  Dialysis  It will take several weeks to several months for your new dialysis access to be ready for use. Your surgeon will determine when it is okay to use it. Your nephrologist will continue to direct your dialysis. You can continue to use your Permcath until your new access is ready for use.   02/24/2018 Sharon [name] [id number] May 20, 1975  Surgeon(s): Early, Arvilla Meres, MD  Procedure(s): Creation of left brachiocephalic AV fistula  x Do not stick fistula for 12 weeks    If you have any questions, please call the office at [phone].

## 2018-02-24 NOTE — Telephone Encounter (Signed)
sch appt spk to pt 04/05/18 1pm Dialysis Duplex 2pm p/o PA

## 2018-02-25 ENCOUNTER — Encounter (HOSPITAL_COMMUNITY): Payer: Self-pay | Admitting: Vascular Surgery

## 2018-03-07 ENCOUNTER — Other Ambulatory Visit: Payer: Self-pay

## 2018-03-07 DIAGNOSIS — N185 Chronic kidney disease, stage 5: Secondary | ICD-10-CM

## 2018-03-14 ENCOUNTER — Encounter (HOSPITAL_COMMUNITY): Payer: PRIVATE HEALTH INSURANCE

## 2018-03-16 ENCOUNTER — Other Ambulatory Visit (HOSPITAL_COMMUNITY): Payer: Self-pay | Admitting: *Deleted

## 2018-03-17 ENCOUNTER — Ambulatory Visit (HOSPITAL_COMMUNITY)
Admission: RE | Admit: 2018-03-17 | Discharge: 2018-03-17 | Disposition: A | Discharge status: Home / Self Care | Payer: PRIVATE HEALTH INSURANCE | Source: Ambulatory Visit | Attending: Nephrology | Admitting: Nephrology

## 2018-03-17 DIAGNOSIS — D631 Anemia in chronic kidney disease: Secondary | ICD-10-CM | POA: Insufficient documentation

## 2018-03-17 DIAGNOSIS — N189 Chronic kidney disease, unspecified: Secondary | ICD-10-CM | POA: Diagnosis not present

## 2018-03-17 MED ORDER — SODIUM CHLORIDE 0.9 % IV SOLN
510.0000 mg | Freq: Once | INTRAVENOUS | Status: AC
  Administered 2018-03-17: 510 mg via INTRAVENOUS
  Filled 2018-03-17: qty 17

## 2018-03-17 NOTE — Progress Notes (Signed)
Pt was about to be DC home when she stated she had a headache 6/10.  Upon further questioning of the patient she stated she was a little short of breath earlier but it subsided and she just had the headache. Pt denies any shortness of breath currently, no chest pain, or dizziness. VS taken and HR and BP noted to be elevated compared to pre-infusion.  Pt stated she is on BP meds and this morning she took HCTZ and the rest of her meds she takes at night and she had them last night.  Reported the above to [place] [facility] [place] [facility] and she stated to tell the pt to take tylenol for her headache and if she developed itiching to take benadryl and it was okay to DC her and I so advised the patient.  I also let her know that if she felt any worse to call Dr Deterdings office or seek medical care and pt verbalized understanding.

## 2018-04-03 ENCOUNTER — Encounter (HOSPITAL_COMMUNITY): Payer: PRIVATE HEALTH INSURANCE

## 2018-04-05 ENCOUNTER — Ambulatory Visit (INDEPENDENT_AMBULATORY_CARE_PROVIDER_SITE_OTHER): Payer: Self-pay | Admitting: Physician Assistant

## 2018-04-05 ENCOUNTER — Ambulatory Visit (HOSPITAL_COMMUNITY)
Admission: RE | Admit: 2018-04-05 | Discharge: 2018-04-05 | Disposition: A | Discharge status: Home / Self Care | Payer: PRIVATE HEALTH INSURANCE | Source: Ambulatory Visit | Attending: Vascular Surgery | Admitting: Vascular Surgery

## 2018-04-05 DIAGNOSIS — N185 Chronic kidney disease, stage 5: Secondary | ICD-10-CM | POA: Diagnosis present

## 2018-04-05 DIAGNOSIS — N184 Chronic kidney disease, stage 4 (severe): Secondary | ICD-10-CM | POA: Insufficient documentation

## 2018-04-05 NOTE — Progress Notes (Signed)
    Postoperative Access Visit   History of Present Illness   Sharon [name] is a 42 y.o. year old female who presents for postoperative follow-up for: left brachiocephalic arteriovenous fistula by Dr. Donnetta Hutching (Date: 02/24/18).  The patient's wounds are healed.  The patient denies steal symptoms.  The patient is able to complete their activities of daily living.  The patient is not yet on hemodialysis.  CKD is managed by Dr. Jimmy Footman.   Physical Examination   Vitals:   04/05/18 1334  BP: 135/83  Pulse: 64  Resp: 12  Temp: 97.7 F (36.5 C)  SpO2: 100%  Weight: 168 lb (76.2 kg)  Height: 5\' 8"  (1.727 m)   Body mass index is 25.54 kg/m.  left arm Incision is healed, hand grip is 5/5, sensation in digits is intact, no palpable thrill, bruit cannot be auscultated; palpable L radial pulse     Medical Decision Making   Sharon [name] is a 42 y.o. year old female who presents s/p left brachiocephalic arteriovenous fistula   Based on physical exam and duplex, L brachiocephalic fistula is occluded  Vein mapping 11/2017 shows a small L basilic vein in AC fossa that becomes larger in mid upper arm; marginal R basilic vein  Plan will be for left arm basilic vein fistula versus AV graft  Patient has appointment with Nephrology this afternoon and would like to discuss dialysis access with them prior to scheduling.  She will call back when she wants to schedule her dialysis access surgery.   Dagoberto Ligas PA-C [facility] [place] Office: [phone]

## 2018-04-21 ENCOUNTER — Other Ambulatory Visit: Payer: Self-pay | Admitting: Vascular Surgery

## 2018-04-21 ENCOUNTER — Encounter: Payer: Self-pay | Admitting: Vascular Surgery

## 2018-05-12 ENCOUNTER — Telehealth: Payer: Self-pay | Admitting: *Deleted

## 2018-05-12 NOTE — Telephone Encounter (Signed)
Call to patient and instructed to be at [facility] admitting at 8:15 am on 05/26/2018 for surgery. Verbalized understanding.

## 2018-05-24 ENCOUNTER — Telehealth: Payer: Self-pay | Admitting: *Deleted

## 2018-05-24 NOTE — Telephone Encounter (Signed)
Patient called to cancel 05/26/2018 surgery due to start time changed. She will call  Later to  reschedule surgery.

## 2018-05-25 ENCOUNTER — Encounter (HOSPITAL_BASED_OUTPATIENT_CLINIC_OR_DEPARTMENT_OTHER): Payer: Self-pay

## 2018-05-25 ENCOUNTER — Other Ambulatory Visit: Payer: Self-pay | Admitting: Obstetrics and Gynecology

## 2018-05-26 ENCOUNTER — Encounter (HOSPITAL_BASED_OUTPATIENT_CLINIC_OR_DEPARTMENT_OTHER): Payer: Self-pay | Admitting: *Deleted

## 2018-05-26 ENCOUNTER — Encounter (HOSPITAL_COMMUNITY): Admission: RE | Payer: Self-pay | Source: Home / Self Care

## 2018-05-26 ENCOUNTER — Other Ambulatory Visit: Payer: Self-pay

## 2018-05-26 ENCOUNTER — Ambulatory Visit (HOSPITAL_COMMUNITY)
Admission: RE | Admit: 2018-05-26 | Payer: PRIVATE HEALTH INSURANCE | Source: Home / Self Care | Admitting: Vascular Surgery

## 2018-05-26 SURGERY — ARTERIOVENOUS (AV) FISTULA CREATION
Anesthesia: Choice | Laterality: Left

## 2018-05-26 NOTE — Progress Notes (Signed)
SPOKE WITH Sharon [name] NPO AFTER MIDNIGHT, ARRIVE 845 AM 06-02-2018 [facility] CHEST XRAY 01-05-18 CARE EVERYWHERE [facility], REQUESTED EKG AND ECHO FROM [facility] VIA FAX HAS LAB APPOINTMENT 05-29-2018 900 AM FOR CBC, BMET NEEDS URINE PREGNANCY DAY OF SURGERY HAS SURGERY ORDERS IN EPIC

## 2018-05-29 ENCOUNTER — Encounter (HOSPITAL_COMMUNITY)
Admission: RE | Admit: 2018-05-29 | Discharge: 2018-05-29 | Disposition: A | Discharge status: Home / Self Care | Payer: PRIVATE HEALTH INSURANCE | Source: Ambulatory Visit | Attending: Obstetrics and Gynecology | Admitting: Obstetrics and Gynecology

## 2018-05-29 DIAGNOSIS — Z01812 Encounter for preprocedural laboratory examination: Secondary | ICD-10-CM | POA: Insufficient documentation

## 2018-05-29 LAB — CBC
HCT: 32.2 % — ABNORMAL LOW (ref 36.0–46.0)
Hemoglobin: 10.2 g/dL — ABNORMAL LOW (ref 12.0–15.0)
MCH: 30.8 pg (ref 26.0–34.0)
MCHC: 31.7 g/dL (ref 30.0–36.0)
MCV: 97.3 fL (ref 80.0–100.0)
Platelets: 238 10*3/uL (ref 150–400)
RBC: 3.31 MIL/uL — ABNORMAL LOW (ref 3.87–5.11)
RDW: 14.1 % (ref 11.5–15.5)
WBC: 5.9 10*3/uL (ref 4.0–10.5)
nRBC: 0 % (ref 0.0–0.2)

## 2018-05-29 LAB — BASIC METABOLIC PANEL
Anion gap: 12 (ref 5–15)
BUN: 74 mg/dL — ABNORMAL HIGH (ref 6–20)
CO2: 27 mmol/L (ref 22–32)
Calcium: 10 mg/dL (ref 8.9–10.3)
Chloride: 98 mmol/L (ref 98–111)
Creatinine, Ser: 4.43 mg/dL — ABNORMAL HIGH (ref 0.44–1.00)
GFR calc Af Amer: 13 mL/min — ABNORMAL LOW (ref >60)
GFR calc non Af Amer: 11 mL/min — ABNORMAL LOW (ref >60)
Glucose, Bld: 84 mg/dL (ref 70–99)
Potassium: 4 mmol/L (ref 3.5–5.1)
Sodium: 137 mmol/L (ref 135–145)

## 2018-05-29 NOTE — Progress Notes (Signed)
BMP result dated 05-29-2018 routed to dr cousins in epic.

## 2018-05-31 NOTE — Progress Notes (Signed)
The following are in the chart received from [facility]: ECHO report 02/17/2018 Exercise stress ECHO 02/17/2018 EKG 01/05/2018

## 2018-06-01 NOTE — Anesthesia Preprocedure Evaluation (Addendum)
Anesthesia Evaluation  Patient identified by MRN, date of birth, ID band Patient awake    Reviewed: Allergy & Precautions, H&P , NPO status , Patient's Chart, lab work & pertinent test results  Airway Mallampati: II  TM Distance: >3 FB Neck ROM: Full    Dental no notable dental hx. (+) Teeth Intact, Dental Advisory Given   Pulmonary neg pulmonary ROS,    Pulmonary exam normal breath sounds clear to auscultation       Cardiovascular hypertension, Normal cardiovascular exam Rhythm:Regular Rate:Normal  02/17/18 Nl Echo Ef 55-60%   Neuro/Psych  Headaches, SZ as a child negative psych ROS   GI/Hepatic negative GI ROS,   Endo/Other    Renal/GU Renal diseaseHx of kidney transplant     Musculoskeletal   Abdominal   Peds  Hematology  (+) Blood dyscrasia, anemia , Pt Je[employer] Albumin OK   Anesthesia Other Findings   Reproductive/Obstetrics negative OB ROS                         Anesthesia Physical Anesthesia Plan  ASA: II  Anesthesia Plan: General   Post-op Pain Management:    Induction: Intravenous  PONV Risk Score and Plan: Treatment may vary due to age or medical condition  Airway Management Planned: LMA  Additional Equipment:   Intra-op Plan:   Post-operative Plan:   Informed Consent: I have reviewed the patients History and Physical, chart, labs and discussed the procedure including the risks, benefits and alternatives for the proposed anesthesia with the patient or authorized representative who has indicated his/her understanding and acceptance.     Dental advisory given  Plan Discussed with: CRNA  Anesthesia Plan Comments: (D&C w Myosure)        Anesthesia Quick Evaluation

## 2018-06-02 ENCOUNTER — Ambulatory Visit (HOSPITAL_BASED_OUTPATIENT_CLINIC_OR_DEPARTMENT_OTHER)
Admission: RE | Admit: 2018-06-02 | Discharge: 2018-06-02 | Disposition: A | Discharge status: Home / Self Care | Payer: PRIVATE HEALTH INSURANCE | Attending: Obstetrics and Gynecology | Admitting: Obstetrics and Gynecology

## 2018-06-02 ENCOUNTER — Ambulatory Visit (HOSPITAL_BASED_OUTPATIENT_CLINIC_OR_DEPARTMENT_OTHER): Payer: PRIVATE HEALTH INSURANCE | Admitting: Anesthesiology

## 2018-06-02 ENCOUNTER — Encounter (HOSPITAL_BASED_OUTPATIENT_CLINIC_OR_DEPARTMENT_OTHER)
Admission: RE | Disposition: A | Discharge status: Home / Self Care | Payer: Self-pay | Source: Home / Self Care | Attending: Obstetrics and Gynecology

## 2018-06-02 ENCOUNTER — Encounter (HOSPITAL_BASED_OUTPATIENT_CLINIC_OR_DEPARTMENT_OTHER): Payer: Self-pay | Admitting: *Deleted

## 2018-06-02 DIAGNOSIS — Z880 Allergy status to penicillin: Secondary | ICD-10-CM | POA: Insufficient documentation

## 2018-06-02 DIAGNOSIS — Z87892 Personal history of anaphylaxis: Secondary | ICD-10-CM | POA: Insufficient documentation

## 2018-06-02 DIAGNOSIS — D638 Anemia in other chronic diseases classified elsewhere: Secondary | ICD-10-CM | POA: Insufficient documentation

## 2018-06-02 DIAGNOSIS — N840 Polyp of corpus uteri: Secondary | ICD-10-CM | POA: Diagnosis not present

## 2018-06-02 DIAGNOSIS — I10 Essential (primary) hypertension: Secondary | ICD-10-CM | POA: Diagnosis not present

## 2018-06-02 DIAGNOSIS — Q613 Polycystic kidney, unspecified: Secondary | ICD-10-CM | POA: Diagnosis not present

## 2018-06-02 DIAGNOSIS — N921 Excessive and frequent menstruation with irregular cycle: Secondary | ICD-10-CM | POA: Insufficient documentation

## 2018-06-02 DIAGNOSIS — Z79899 Other long term (current) drug therapy: Secondary | ICD-10-CM | POA: Diagnosis not present

## 2018-06-02 HISTORY — PX: DILATATION & CURETTAGE/HYSTEROSCOPY WITH MYOSURE: SHX6511

## 2018-06-02 HISTORY — DX: Hypo-osmolality and hyponatremia: E87.1

## 2018-06-02 LAB — POCT I-STAT 4, (NA,K, GLUC, HGB,HCT)
Glucose, Bld: 78 mg/dL (ref 70–99)
HCT: 29 % — ABNORMAL LOW (ref 36.0–46.0)
Hemoglobin: 9.9 g/dL — ABNORMAL LOW (ref 12.0–15.0)
Potassium: 4.4 mmol/L (ref 3.5–5.1)
Sodium: 137 mmol/L (ref 135–145)

## 2018-06-02 LAB — POCT PREGNANCY, URINE: Preg Test, Ur: NEGATIVE

## 2018-06-02 SURGERY — DILATATION & CURETTAGE/HYSTEROSCOPY WITH MYOSURE
Anesthesia: General | Site: Uterus

## 2018-06-02 MED ORDER — FENTANYL CITRATE (PF) 100 MCG/2ML IJ SOLN
INTRAMUSCULAR | Status: AC
  Filled 2018-06-02: qty 2

## 2018-06-02 MED ORDER — PROPOFOL 10 MG/ML IV BOLUS
INTRAVENOUS | Status: AC
  Filled 2018-06-02: qty 20

## 2018-06-02 MED ORDER — DEXAMETHASONE SODIUM PHOSPHATE 10 MG/ML IJ SOLN
INTRAMUSCULAR | Status: DC | PRN
  Administered 2018-06-02: 5 mg via INTRAVENOUS

## 2018-06-02 MED ORDER — PROPOFOL 10 MG/ML IV BOLUS
INTRAVENOUS | Status: DC | PRN
  Administered 2018-06-02: 150 mg via INTRAVENOUS

## 2018-06-02 MED ORDER — DEXAMETHASONE SODIUM PHOSPHATE 10 MG/ML IJ SOLN
INTRAMUSCULAR | Status: AC
  Filled 2018-06-02: qty 1

## 2018-06-02 MED ORDER — HYDROCODONE-ACETAMINOPHEN 7.5-325 MG PO TABS
ORAL_TABLET | ORAL | Status: AC
  Filled 2018-06-02: qty 1

## 2018-06-02 MED ORDER — HYDROMORPHONE HCL 1 MG/ML IJ SOLN
INTRAMUSCULAR | Status: AC
  Filled 2018-06-02: qty 1

## 2018-06-02 MED ORDER — SODIUM CHLORIDE 0.9 % IR SOLN
Status: DC | PRN
  Administered 2018-06-02: 1

## 2018-06-02 MED ORDER — ACETAMINOPHEN 500 MG PO TABS
ORAL_TABLET | ORAL | Status: AC
  Filled 2018-06-02: qty 2

## 2018-06-02 MED ORDER — ONDANSETRON HCL 4 MG/2ML IJ SOLN
INTRAMUSCULAR | Status: DC | PRN
  Administered 2018-06-02: 4 mg via INTRAVENOUS

## 2018-06-02 MED ORDER — FENTANYL CITRATE (PF) 100 MCG/2ML IJ SOLN
INTRAMUSCULAR | Status: DC | PRN
  Administered 2018-06-02: 25 ug via INTRAVENOUS
  Administered 2018-06-02: 50 ug via INTRAVENOUS
  Administered 2018-06-02: 25 ug via INTRAVENOUS

## 2018-06-02 MED ORDER — HYDROMORPHONE HCL 1 MG/ML IJ SOLN
0.2500 mg | INTRAMUSCULAR | Status: DC | PRN
  Administered 2018-06-02: 0.25 mg via INTRAVENOUS
  Administered 2018-06-02: 0.5 mg via INTRAVENOUS
  Administered 2018-06-02: 0.25 mg via INTRAVENOUS
  Administered 2018-06-02: 0.5 mg via INTRAVENOUS
  Filled 2018-06-02: qty 0.5

## 2018-06-02 MED ORDER — LIDOCAINE 2% (20 MG/ML) 5 ML SYRINGE
INTRAMUSCULAR | Status: DC | PRN
  Administered 2018-06-02: 100 mg via INTRAVENOUS

## 2018-06-02 MED ORDER — HYDROCODONE-ACETAMINOPHEN 7.5-325 MG PO TABS
1.0000 | ORAL_TABLET | Freq: Once | ORAL | Status: AC | PRN
  Administered 2018-06-02: 1 via ORAL
  Filled 2018-06-02: qty 1

## 2018-06-02 MED ORDER — GABAPENTIN 300 MG PO CAPS
ORAL_CAPSULE | ORAL | Status: AC
  Filled 2018-06-02: qty 1

## 2018-06-02 MED ORDER — MEPERIDINE HCL 25 MG/ML IJ SOLN
6.2500 mg | INTRAMUSCULAR | Status: DC | PRN
  Filled 2018-06-02: qty 1

## 2018-06-02 MED ORDER — MIDAZOLAM HCL 2 MG/2ML IJ SOLN
INTRAMUSCULAR | Status: DC | PRN
  Administered 2018-06-02: 2 mg via INTRAVENOUS

## 2018-06-02 MED ORDER — CIPROFLOXACIN IN D5W 400 MG/200ML IV SOLN
400.0000 mg | INTRAVENOUS | Status: DC
  Filled 2018-06-02: qty 200

## 2018-06-02 MED ORDER — GABAPENTIN 300 MG PO CAPS
300.0000 mg | ORAL_CAPSULE | Freq: Once | ORAL | Status: AC
  Administered 2018-06-02: 300 mg via ORAL
  Filled 2018-06-02: qty 1

## 2018-06-02 MED ORDER — SODIUM CHLORIDE 0.9 % IV SOLN
INTRAVENOUS | Status: DC
  Filled 2018-06-02: qty 1000

## 2018-06-02 MED ORDER — ONDANSETRON HCL 4 MG/2ML IJ SOLN
INTRAMUSCULAR | Status: AC
  Filled 2018-06-02: qty 2

## 2018-06-02 MED ORDER — SODIUM CHLORIDE 0.9 % IV SOLN
INTRAVENOUS | Status: DC
  Administered 2018-06-02 (×2): via INTRAVENOUS
  Filled 2018-06-02: qty 1000

## 2018-06-02 MED ORDER — ACETAMINOPHEN 500 MG PO TABS
1000.0000 mg | ORAL_TABLET | Freq: Once | ORAL | Status: AC
  Administered 2018-06-02: 1000 mg via ORAL
  Filled 2018-06-02: qty 2

## 2018-06-02 MED ORDER — LIDOCAINE 2% (20 MG/ML) 5 ML SYRINGE
INTRAMUSCULAR | Status: AC
  Filled 2018-06-02: qty 5

## 2018-06-02 MED ORDER — MIDAZOLAM HCL 2 MG/2ML IJ SOLN
INTRAMUSCULAR | Status: AC
  Filled 2018-06-02: qty 2

## 2018-06-02 MED ORDER — CIPROFLOXACIN IN D5W 400 MG/200ML IV SOLN
INTRAVENOUS | Status: AC
  Filled 2018-06-02: qty 200

## 2018-06-02 MED ORDER — ONDANSETRON HCL 4 MG/2ML IJ SOLN
4.0000 mg | Freq: Once | INTRAMUSCULAR | Status: AC | PRN
  Administered 2018-06-02: 4 mg via INTRAVENOUS
  Filled 2018-06-02: qty 2

## 2018-06-02 SURGICAL SUPPLY — 26 items
BIPOLAR CUTTING LOOP 21FR (ELECTRODE)
CANISTER SUCT 3000ML PPV (MISCELLANEOUS) ×2 IMPLANT
CATH ROBINSON RED A/P 16FR (CATHETERS) ×1 IMPLANT
COVER BACK TABLE 60X90IN (DRAPES) ×2 IMPLANT
COVER WAND RF STERILE (DRAPES) ×4 IMPLANT
DEVICE MYOSURE LITE (MISCELLANEOUS) IMPLANT
DEVICE MYOSURE REACH (MISCELLANEOUS) ×1 IMPLANT
DILATOR CANAL MILEX (MISCELLANEOUS) IMPLANT
DRAPE SHEET LG 3/4 BI-LAMINATE (DRAPES) ×2 IMPLANT
GAUZE 4X4 16PLY RFD (DISPOSABLE) ×2 IMPLANT
GLOVE BIOGEL PI IND STRL 7.0 (GLOVE) ×1 IMPLANT
GLOVE BIOGEL PI INDICATOR 7.0 (GLOVE) ×1
GLOVE ECLIPSE 6.5 STRL STRAW (GLOVE) ×2 IMPLANT
GOWN STRL REUS W/TWL LRG LVL3 (GOWN DISPOSABLE) ×2 IMPLANT
IV NS IRRIG 3000ML ARTHROMATIC (IV SOLUTION) ×2 IMPLANT
KIT PROCEDURE FLUENT (KITS) ×2 IMPLANT
KIT TURNOVER CYSTO (KITS) ×2 IMPLANT
LOOP CUTTING BIPOLAR 21FR (ELECTRODE) IMPLANT
MYOSURE XL FIBROID (MISCELLANEOUS)
PACK VAGINAL MINOR WOMEN LF (CUSTOM PROCEDURE TRAY) ×2 IMPLANT
PAD OB MATERNITY 4.3X12.25 (PERSONAL CARE ITEMS) ×2 IMPLANT
PAD PREP 24X48 CUFFED NSTRL (MISCELLANEOUS) ×2 IMPLANT
SEAL ROD LENS SCOPE MYOSURE (ABLATOR) ×2 IMPLANT
SYSTEM TISS REMOVAL MYOSURE XL (MISCELLANEOUS) IMPLANT
TOWEL OR 17X24 6PK STRL BLUE (TOWEL DISPOSABLE) ×4 IMPLANT
WATER STERILE IRR 500ML POUR (IV SOLUTION) ×2 IMPLANT

## 2018-06-02 NOTE — Discharge Instructions (Signed)
°  Post Anesthesia Home Care Instructions  Activity: Get plenty of rest for the remainder of the day. A responsible individual must stay with you for 24 hours following the procedure.  For the next 24 hours, DO NOT: -Drive a car -Paediatric nurse -Drink alcoholic beverages -Take any medication unless instructed by your physician -Make any legal decisions or sign important papers.  Meals: Start with liquid foods such as gelatin or soup. Progress to regular foods as tolerated. Avoid greasy, spicy, heavy foods. If nausea and/or vomiting occur, drink only clear liquids until the nausea and/or vomiting subsides. Call your physician if vomiting continues.  Special Instructions/Symptoms: Your throat may feel dry or sore from the anesthesia or the breathing tube placed in your throat during surgery. If this causes discomfort, gargle with warm salt water. The discomfort should disappear within 24 hours.  DISCHARGE INSTRUCTIONS: D&C  The following instructions have been prepared to help you care for yourself upon your return home.   Personal hygiene:  Use sanitary pads for vaginal drainage, not tampons.  Shower the day after your procedure.  NO tub baths, pools or Jacuzzis for 2-3 weeks.  Wipe front to back after using the bathroom.  Activity and limitations:  Do NOT drive or operate any equipment for 24 hours. The effects of anesthesia are still present and drowsiness may result.  Do NOT rest in bed all day.  Walking is encouraged.  Walk up and down stairs slowly.  You may resume your normal activity in one to two days or as indicated by your physician.  Sexual activity: NO intercourse for at least 2 weeks after the procedure, or as indicated by your physician.  Diet: Eat a light meal as desired this evening. You may resume your usual diet tomorrow.  Return to work: You may resume your work activities in one to two days or as indicated by your doctor.  What to expect after your  surgery: Expect to have vaginal bleeding/discharge for 2-3 days and spotting for up to 10 days. It is not unusual to have soreness for up to 1-2 weeks. You may have a slight burning sensation when you urinate for the first day. Mild cramps may continue for a couple of days. You may have a regular period in 2-6 weeks.  Call your doctor for any of the following:  Excessive vaginal bleeding, saturating and changing one pad every hour.  Inability to urinate 6 hours after discharge from hospital.  Pain not relieved by pain medication.  Fever of 100.4 F or greater.  Unusual vaginal discharge or odor.  May take Tylenol after 2 PM.

## 2018-06-02 NOTE — Anesthesia Procedure Notes (Signed)
Procedure Name: LMA Insertion Date/Time: 06/02/2018 11:19 AM Performed by: Suan Halter, CRNA Pre-anesthesia Checklist: Patient identified, Emergency Drugs available, Suction available and Patient being monitored Patient Re-evaluated:Patient Re-evaluated prior to induction Oxygen Delivery Method: Circle system utilized Preoxygenation: Pre-oxygenation with 100% oxygen Induction Type: IV induction Ventilation: Mask ventilation without difficulty LMA: LMA inserted LMA Size: 4.0 Number of attempts: 1 Airway Equipment and Method: Bite block Placement Confirmation: positive ETCO2 Tube secured with: Tape Dental Injury: Teeth and Oropharynx as per pre-operative assessment

## 2018-06-02 NOTE — Anesthesia Postprocedure Evaluation (Signed)
Anesthesia Post Note  Patient: Sharon [name]  Procedure(s) Performed: DILATATION & CURETTAGE/HYSTEROSCOPY WITH MYOSURE (N/A Uterus)     Patient location during evaluation: PACU Anesthesia Type: General Level of consciousness: awake and alert Pain management: pain level controlled Vital Signs Assessment: post-procedure vital signs reviewed and stable Respiratory status: spontaneous breathing, nonlabored ventilation, respiratory function stable and patient connected to nasal cannula oxygen Cardiovascular status: blood pressure returned to baseline and stable Postop Assessment: no apparent nausea or vomiting Anesthetic complications: no    Last Vitals:  Vitals:   06/02/18 1300 06/02/18 1315  BP: (!) 173/92 (!) 169/93  Pulse: 66 64  Resp: 12 15  Temp:    SpO2: 99% 96%    Last Pain:  Vitals:   06/02/18 1300  TempSrc:   PainSc: Asleep                 Barnet Glasgow

## 2018-06-02 NOTE — Transfer of Care (Signed)
Immediate Anesthesia Transfer of Care Note  Patient: Sharon [name]  Procedure(s) Performed: Procedure(s) (LRB): DILATATION & CURETTAGE/HYSTEROSCOPY WITH MYOSURE (N/A)  Patient Location: PACU  Anesthesia Type: General  Level of Consciousness: awake, oriented, sedated and patient cooperative  Airway & Oxygen Therapy: Patient Spontanous Breathing and Patient connected to face mask oxygen  Post-op Assessment: Report given to PACU RN and Post -op Vital signs reviewed and stable  Post vital signs: Reviewed and stable  Complications: No apparent anesthesia complications Last Vitals:  Vitals Value Taken Time  BP 141/79 06/02/2018 11:57 AM  Temp    Pulse 78 06/02/2018 11:57 AM  Resp    SpO2 99 % 06/02/2018 11:57 AM  Vitals shown include unvalidated device data.  Last Pain:  Vitals:   06/02/18 0904  TempSrc: Oral

## 2018-06-02 NOTE — Op Note (Signed)
[name], [name] MEDICAL RECORD [id number] ACCOUNT [id number] DATE OF BIRTH:26-May-1975 FACILITY: [facility] LOCATION: [facility] PHYSICIAN:Elleanor Guyett A. Permelia Bamba, MD  OPERATIVE REPORT  DATE OF PROCEDURE:  06/02/2018  PREOPERATIVE DIAGNOSES:  Menorrhagia with irregular cycle, endometrial mass.  PROCEDURE:  Diagnostic hysteroscopy, hysteroscopic resection of endometrial polyp, dilation and curettage.  POSTOPERATIVE DIAGNOSES:  Submucosal fibroid, endometrial polyp, menorrhagia with irregular cycles.  ANESTHESIA:  General.  SURGEON:  Servando Salina, MD  ASSISTANT:  None.  DESCRIPTION OF PROCEDURE:  Under adequate general anesthesia, the patient was placed in the dorsal lithotomy position.  She was sterilely prepped and draped in the usual fashion.  Bladder was catheterized of a moderate amount of urine.  Examination under anesthesia revealed an enlarged, irregular uterus.  No adnexal masses could be appreciated.  Bivalve speculum was placed in the vagina.  A single-tooth tenaculum was placed on the anterior lip of the cervix.  The cervix was easily dilated up to a #21  Pratt dilator, and a MyoSure hysteroscope was introduced into the uterine cavity.  A distorted, enlarged endometrial cavity was encountered.  The left tubal ostia could be seen.  The right was not seen.  Several polypoid lesions were noted, particularly on the anterior wall of the uterus.  Using the Reach resectoscope, the endometrial cavity was resected, and the fascia was resected between the fundal area.  It was then noted that the patient had a large submucosal component to the fibroid at that area  which had caused the distortion of the uterine anatomy.  Once the endometrial cavity and the polypoid lesions were resected, the procedure was then terminated by removing all instruments from the vagina.  SPECIMEN:  Labeled endometrial curettings with polyp sent to pathology.  ESTIMATED BLOOD LOSS:  10  mL.  COMPLICATIONS:  None.  DISPOSITION:  The patient tolerated the procedure well and was transferred to recovery room in stable condition.  LN/NUANCE  D:06/02/2018 T:06/02/2018 JOB:[id number]

## 2018-06-02 NOTE — H&P (Signed)
Sharon [name] is an 43 y.o. female. G1P1 DBF here for surgical mgmt of endometrial mass noted on sono hysterogram done for menorrhagia with irreg cycles. Hx uterine fibroids. Pt is a Jehovah witness. Pt has polycystic kidney disease and is awaiting kidney transplant  Pertinent Gynecological History: Menses: flow is excessive with use of 6 pads or tampons on heaviest days Bleeding: dysfunctional uterine bleeding Contraception: none DES exposure: denies Blood transfusions: none Sexually transmitted diseases: no past history Previous GYN Procedures: DNC  Last mammogram: normal Date: 04/03/18 Last pap: normal Date:04/03/2018 OB History: G1P1   Menstrual History: Menarche age: n/a Patient's last menstrual period was 05/20/2018.    Past Medical History:  Diagnosis Date  . Anemia   . Contact lens/glasses fitting   . Headache    MIGRAINES AND TENSION  . Hypertension   . Hyponatremia   . 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 Seizures ([place])    " as a child only"    Past Surgical History:  Procedure Laterality Date  . AV FISTULA PLACEMENT Left 02/24/2018   Procedure: ARTERIOVENOUS (AV) FISTULA CREATION LEFT ARM;  Surgeon: Rosetta Posner, MD;  Location: [facility] [facility];  Service: Vascular;  Laterality: Left;  . BUNIONECTOMY     AS CHILD  . COLONSCOPY  2014  . HERNIA REPAIR  [id number]   UMBILICAL  . HYSTEROSCOPY W/D&C N/A 10/23/2012   Procedure: DILATATION AND CURETTAGE / Diagnostic HYSTEROSCOPY;  Surgeon: Marvene Staff, MD;  Location: [facility];  Service: Gynecology;  Laterality: N/A;  . MOUTH SURGERY  AS TEENAGER  . ROBOTIC ASSISTED LAPAROSCOPIC LYSIS OF ADHESION N/A 10/23/2012   Procedure: ROBOTIC ASSISTED  excision OF stage 2 pelvic ENDOMETRIOSIS;  Surgeon: Marvene Staff, MD;  Location: [facility];  Service: Gynecology;  Laterality: N/A;    Family History  Problem Relation Age of Onset  . Hypertension Mother   . Polycystic kidney  disease Mother   . Hypertension Father     Social History:  reports that she has never smoked. She has never used smokeless tobacco. She reports current alcohol use. She reports that she does not use drugs.  Allergies:  Allergies  Allergen Reactions  . Penicillins Anaphylaxis    Did it involve swelling of the face/tongue/throat, SOB, or low BP? Yes Did it involve sudden or severe rash/hives, skin peeling, or any reaction on the inside of your mouth or nose? No Did you need to seek medical attention at a hospital or doctor's office? Yes When did it last happen?Childhood allergy If all above answers are "NO", may proceed with cephalosporin use.   . Other     NO BLOOD PRODUCTS    No medications prior to admission.    Review of Systems  All other systems reviewed and are negative.   Height 5\' 8"  (1.727 m), weight 73.5 kg, last menstrual period 05/20/2018. Physical Exam  Constitutional: She is oriented to person, place, and time. She appears well-developed and well-nourished.  HENT:  Head: Atraumatic.  Eyes: EOM are normal.  Neck: Neck supple.  Cardiovascular: Regular rhythm.  Respiratory: Breath sounds normal.  GI: Soft.  Genitourinary:    Genitourinary Comments: Vulva nl  Vagina nl  Cervix parous Uterus irreg enlarged And no palp mass   Neurological: She is alert and oriented to person, place, and time.  Skin: Skin is warm and dry.  Psychiatric: She has a normal mood and affect.    No results found  for this or any previous visit (from the past 24 hour(s)).  No results found.  Assessment/Plan: Menorrhagia with irregular cycle Endometrial mass Anemia of chronic disease Polycystic kidney disease P) dx hysteroscopy, D&C, resection of endometrial mass. Risk of surgery includes infection, bleeding, injury to surrounding organ structures, uterine perforation and its risk ( HIV, acute rxn, hepatitis), fluid overload and its mgmt, internal scar tissue. All ?  answered  Mccade Sullenberger A Kellen Hover 06/02/2018, 7:29 AM

## 2018-06-05 ENCOUNTER — Encounter (HOSPITAL_BASED_OUTPATIENT_CLINIC_OR_DEPARTMENT_OTHER): Payer: Self-pay | Admitting: Obstetrics and Gynecology

## 2018-06-20 ENCOUNTER — Other Ambulatory Visit: Payer: Self-pay | Admitting: Obstetrics and Gynecology

## 2018-06-20 DIAGNOSIS — D250 Submucous leiomyoma of uterus: Secondary | ICD-10-CM

## 2018-06-27 ENCOUNTER — Encounter: Payer: Self-pay | Admitting: Radiology

## 2018-06-27 ENCOUNTER — Ambulatory Visit
Admission: RE | Admit: 2018-06-27 | Discharge: 2018-06-27 | Disposition: A | Discharge status: Home / Self Care | Payer: PRIVATE HEALTH INSURANCE | Source: Ambulatory Visit | Attending: Obstetrics and Gynecology | Admitting: Obstetrics and Gynecology

## 2018-06-27 DIAGNOSIS — D250 Submucous leiomyoma of uterus: Secondary | ICD-10-CM

## 2018-06-27 HISTORY — PX: IR RADIOLOGIST EVAL & MGMT: IMG5224

## 2018-06-27 NOTE — Consult Note (Signed)
Chief Complaint: Symptomatic uterine fibroids  Referring Physician(s): Cousins,Sheronette [name] (Nephrology)  History of Present Illness: Sharon [name] is a 43 y.o. (G1, P66) female with past medical history significant for hypertension and chronic advanced renal insufficiency secondary to polycystic kidney disease (with recently created upper arm AV fistula that has failed to mature, though the patient is currently not on dialysis and has never been on dialysis previously) who presents today for evaluation of symptomatic uterine fibroids.  The patient is unaccompanied and serves as her own historian.  The patient's main fibroid related complaint is in regards to her persistent menorrhagia.  Patient states that her cycles have remained regular lasting approximately 6 days, however 4 days of which are associated with marked menorrhagia including the passage of clots and necessitating the changing of pads every 2 hours.  Patient underwent a hysteroscopic D&C and endometrial polyp resection in early February which she states has minimally improved her menorrhagia, though she has only experienced 1 cycle since undergoing this intervention.  The patient denies intraprocedural spotting.  Patient does report cold symptoms including abdominal bloating, pelvic pain and urinary frequency, though she states her urinary frequency may be a side effect of some of her antihypertensive medication.  Patient is chronically anemic though has never received a blood transfusion (the patient is a Sales promotion account executive Witness).  The patient is interested in avoiding a hysterectomy if at all possible though has no future fertility plans.  Past Medical History:  Diagnosis Date  . Anemia   . Contact lens/glasses fitting   . Headache    MIGRAINES AND TENSION  . Hypertension   . Hyponatremia   . 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 Seizures ([place])    " as a child  only"    Past Surgical History:  Procedure Laterality Date  . AV FISTULA PLACEMENT Left 02/24/2018   Procedure: ARTERIOVENOUS (AV) FISTULA CREATION LEFT ARM;  Surgeon: Rosetta Posner, MD;  Location: [facility];  Service: Vascular;  Laterality: Left;  . BUNIONECTOMY     AS CHILD  . COLONSCOPY  2014  . DILATATION & CURETTAGE/HYSTEROSCOPY WITH MYOSURE N/A 06/02/2018   Procedure: [place];  Surgeon: Servando Salina, MD;  Location: [facility];  Service: Gynecology;  Laterality: N/A;  . HERNIA REPAIR  [id number]   UMBILICAL  . HYSTEROSCOPY W/D&C N/A 10/23/2012   Procedure: DILATATION AND CURETTAGE / Diagnostic HYSTEROSCOPY;  Surgeon: Marvene Staff, MD;  Location: [facility];  Service: Gynecology;  Laterality: N/A;  . MOUTH SURGERY  AS TEENAGER  . ROBOTIC ASSISTED LAPAROSCOPIC LYSIS OF ADHESION N/A 10/23/2012   Procedure: ROBOTIC ASSISTED  excision OF stage 2 pelvic ENDOMETRIOSIS;  Surgeon: Marvene Staff, MD;  Location: [facility];  Service: Gynecology;  Laterality: N/A;    Allergies: Penicillins and Other  Medications: Prior to Admission medications   Medication Sig Start Date End Date Taking? Authorizing Provider  amLODipine (NORVASC) 5 MG tablet Take 5 mg by mouth daily.   Yes [provider]  calcitRIOL (ROCALTROL) 0.5 MCG capsule Take 0.5 mcg by mouth at bedtime.    Yes [provider]  Epoetin Alfa-epbx (RETACRIT IJ) Inject 30,000 Units into the skin every 21 ( twenty-one) days.    Yes [provider]  hydrochlorothiazide (HYDRODIURIL) 25 MG tablet Take 25 mg by mouth every morning.    Yes [provider]  loratadine (CLARITIN) 10 MG tablet Take 10 mg  by mouth daily as needed for allergies.   Yes [provider]  potassium chloride SA (K-DUR,KLOR-CON) 20 MEQ tablet Take 20 mEq by mouth at bedtime.    Yes [provider]  sodium bicarbonate 650 MG tablet Take 1,300 mg by mouth 2 (two)  times daily.   Yes [provider]  telmisartan (MICARDIS) 80 MG tablet Take 80 mg by mouth at bedtime.    Yes [provider]  Tolvaptan (JYNARQUE) 90 & 30 MG TBPK Take 30-90 mg by mouth daily. Takes 90 mg by mouth in the morning and 30 mg in the evening   Yes [provider]     Family History  Problem Relation Age of Onset  . Hypertension Mother   . Polycystic kidney disease Mother   . Hypertension Father     Social History   Socioeconomic History  . Marital status: Divorced    Spouse name: Not on file  . Number of children: Not on file  . Years of education: Not on file  . Highest education level: Not on file  Occupational History  . Not on file  Social Needs  . Financial resource strain: Not on file  . Food insecurity:    Worry: Not on file    Inability: Not on file  . Transportation needs:    Medical: Not on file    Non-medical: Not on file  Tobacco Use  . Smoking status: Never Smoker  . Smokeless tobacco: Never Used  Substance and Sexual Activity  . Alcohol use: Yes    Comment: occasional  . Drug use: No  . Sexual activity: Not on file  Lifestyle  . Physical activity:    Days per week: Not on file    Minutes per session: Not on file  . Stress: Not on file  Relationships  . Social connections:    Talks on phone: Not on file    Gets together: Not on file    Attends religious service: Not on file    Active member of club or organization: Not on file    Attends meetings of clubs or organizations: Not on file    Relationship status: Not on file  Other Topics Concern  . Not on file  Social History Narrative  . Not on file    ECOG Status: 1 - Symptomatic but completely ambulatory  Review of Systems: A 12 point ROS discussed and pertinent positives are indicated in the HPI above.  All other systems are negative.  Review of Systems  Constitutional: Negative for activity change, fatigue and fever.  Cardiovascular: Negative.     Genitourinary: Positive for frequency, menstrual problem, pelvic pain and vaginal bleeding.    Vital Signs: BP 137/82   Pulse 85   Temp 98.3 F (36.8 C) (Oral)   Resp 15   Ht 5\' 8"  (1.727 m)   Wt 73.5 kg   LMP 06/15/2018 (Exact Date)   SpO2 100%   BMI 24.63 kg/m   Physical Exam  Mallampati Score:     Imaging: No results found.  Labs:  CBC: Recent Labs    02/24/18 0841 05/29/18 0843 06/02/18 1018  WBC  --  5.9  --   HGB 9.9* 10.2* 9.9*  HCT 29.0* 32.2* 29.0*  PLT  --  238  --     COAGS: No results for input(s): INR, APTT in the last 8760 hours.  BMP: Recent Labs    02/24/18 0841 05/29/18 0843 06/02/18 1018  NA 139  137 137  K 3.8 4.0 4.4  CL  --  98  --   CO2  --  27  --   GLUCOSE 86 84 78  BUN  --  74*  --   CALCIUM  --  10.0  --   CREATININE  --  4.43*  --   GFRNONAA  --  11*  --   GFRAA  --  13*  --     LIVER FUNCTION TESTS: No results for input(s): BILITOT, AST, ALT, ALKPHOS, PROT, ALBUMIN in the last 8760 hours.  TUMOR MARKERS: No results for input(s): AFPTM, CEA, CA199, CHROMGRNA in the last 8760 hours.  Assessment and Plan:  Sharon [name] is a 43 y.o. (G1, P64) female with past medical history significant for hypertension and chronic advanced renal insufficiency secondary to polycystic kidney disease (with recently created upper arm AV fistula that has failed to mature, though the patient is currently not on dialysis and has never been on dialysis previously) who presents today for evaluation of symptomatic uterine fibroids.   The patient's main fibroid related complaint is in regards to her persistent menorrhagia as detailed above.  While chronically anemic, the patient has never received a blood transfusion (the patient is a Jehovah's Witness) and a component of her anemia is also likely secondary to her advanced renal insufficiency.  While I am sympathetic the patient is not an ideal candidate for hysterectomy, I am concerned the  patient is likewise not a good candidate for uterine fibroid embolization given her advanced renal dysfunction.  I explained that typically we perform a contrast-enhanced pelvic MRI for procedural planning purposes, though she is unlikely to be able to receive contrast due to her advanced renal insufficiency.  Additionally, I explained that uterine fibroid embolization is performed with the administration of contrast (albeit a different agent then utilized for MRI), and again I am concerned of the impact of using contrast in her given her advanced renal insufficiency.  As such, I will discuss appropriateness of this procedure with the patient's nephrologist, Dr. Jimmy Footman.  As always, contrast administration will be utilized judiciously though is difficult to predict total contrast volume necessary as it depends on vascular anatomy as well as the amount of embolic material required to obtain a adequate embolization.  Potential ways to minimize contrast related nephrotoxicity could include having the patient arrived several hours prior to the procedure for preprocedural intravenous hydration and Mucomyst administration.  A final alternative would be to perform a staged embolization treating only one side at a time, allowing the patient to recover from the procedure, then pursuing contralateral embolization if symptoms persist.    Regardless, any procedure must be done under the approval and guidance of Dr. Jimmy Footman, but truthfully, given her advanced renal insufficiency I feel it is unlikely she will be a candidate for embolization.  Plan: -We will contact Dr. Jimmy Footman regarding the appropriateness of proceeding with uterine fibroid embolization. - If Dr. Jimmy Footman does not want [place] to proceed with the embolization, we will call the patient to relay this decision.  - If Dr. Jimmy Footman grants approval to pursue embolization, I will see the patient in repeat consultation for in-depth discussion  regarding risk-benefit ratio and procedural details.  Above was discussed at great length with the patient who is in agreement with the above plan of care.  Patient knows to call the interventional radiology clinic with any interval questions or concerns.     Thank you for this interesting consult.  I  greatly enjoyed meeting Sharon [name] and look forward to participating in their care.  A copy of this report was sent to the requesting provider on this date.  Electronically Signed: Sandi Mariscal 06/27/2018, 9:20 AM   I spent a total of 30 Minutes in face to face in clinical consultation, greater than 50% of which was counseling/coordinating care for symptomatic uterine fibroids.

## 2018-08-15 ENCOUNTER — Other Ambulatory Visit: Payer: Self-pay | Admitting: *Deleted

## 2018-08-15 NOTE — Progress Notes (Signed)
Sharon [name] at [facility] notified of posted surgery date and time. Call to patient to schedule surgery. Patient very reluctantly agreed to proceed with surgery for 08/21/2018 with 5:30 am arrival at [facility] admitting for [facility] and Left arm access with Dr. Donnetta Hutching. Instructed No food or drink past MN night prior and must have a driver and caregiver for discharge. Expect a call and follow the detailed surgery/ medication instructions received from the hospital pre-admission department. Reviewed visitor restrictions. Verbalized understanding.

## 2018-08-18 ENCOUNTER — Encounter (HOSPITAL_COMMUNITY): Payer: Self-pay | Admitting: *Deleted

## 2018-08-18 ENCOUNTER — Other Ambulatory Visit: Payer: Self-pay

## 2018-08-18 NOTE — Progress Notes (Signed)
Spoke with pt for pre-op call. Pt denies cardiac history or Diabetes. Is treated for HTN.    Coronavirus Screening  Have you experienced the following symptoms:  Cough no Fever (>100.34F) no Runny nose no Sore throat no Difficulty breathing/shortness of breath  no  Have you or a family member traveled in the last 14 days and where? no

## 2018-08-21 ENCOUNTER — Ambulatory Visit (HOSPITAL_COMMUNITY): Payer: PRIVATE HEALTH INSURANCE | Admitting: Certified Registered Nurse Anesthetist

## 2018-08-21 ENCOUNTER — Encounter (HOSPITAL_COMMUNITY): Payer: Self-pay

## 2018-08-21 ENCOUNTER — Encounter (HOSPITAL_COMMUNITY)
Admission: RE | Disposition: A | Discharge status: Home / Self Care | Payer: Self-pay | Source: Home / Self Care | Attending: Vascular Surgery

## 2018-08-21 ENCOUNTER — Ambulatory Visit (HOSPITAL_COMMUNITY): Payer: PRIVATE HEALTH INSURANCE

## 2018-08-21 ENCOUNTER — Other Ambulatory Visit: Payer: Self-pay

## 2018-08-21 ENCOUNTER — Ambulatory Visit (HOSPITAL_COMMUNITY)
Admission: RE | Admit: 2018-08-21 | Discharge: 2018-08-21 | Disposition: A | Discharge status: Home / Self Care | Payer: PRIVATE HEALTH INSURANCE | Attending: Vascular Surgery | Admitting: Vascular Surgery

## 2018-08-21 DIAGNOSIS — N186 End stage renal disease: Secondary | ICD-10-CM | POA: Insufficient documentation

## 2018-08-21 DIAGNOSIS — D631 Anemia in chronic kidney disease: Secondary | ICD-10-CM | POA: Diagnosis not present

## 2018-08-21 DIAGNOSIS — Z8249 Family history of ischemic heart disease and other diseases of the circulatory system: Secondary | ICD-10-CM | POA: Diagnosis not present

## 2018-08-21 DIAGNOSIS — Q613 Polycystic kidney, unspecified: Secondary | ICD-10-CM | POA: Diagnosis not present

## 2018-08-21 DIAGNOSIS — N185 Chronic kidney disease, stage 5: Secondary | ICD-10-CM

## 2018-08-21 DIAGNOSIS — Z841 Family history of disorders of kidney and ureter: Secondary | ICD-10-CM | POA: Diagnosis not present

## 2018-08-21 DIAGNOSIS — E871 Hypo-osmolality and hyponatremia: Secondary | ICD-10-CM | POA: Diagnosis not present

## 2018-08-21 DIAGNOSIS — Z880 Allergy status to penicillin: Secondary | ICD-10-CM | POA: Insufficient documentation

## 2018-08-21 DIAGNOSIS — G43909 Migraine, unspecified, not intractable, without status migrainosus: Secondary | ICD-10-CM | POA: Insufficient documentation

## 2018-08-21 DIAGNOSIS — Z95828 Presence of other vascular implants and grafts: Secondary | ICD-10-CM

## 2018-08-21 DIAGNOSIS — Z992 Dependence on renal dialysis: Secondary | ICD-10-CM

## 2018-08-21 DIAGNOSIS — Z79899 Other long term (current) drug therapy: Secondary | ICD-10-CM | POA: Diagnosis not present

## 2018-08-21 DIAGNOSIS — T82868A Thrombosis of vascular prosthetic devices, implants and grafts, initial encounter: Secondary | ICD-10-CM | POA: Insufficient documentation

## 2018-08-21 DIAGNOSIS — X58XXXA Exposure to other specified factors, initial encounter: Secondary | ICD-10-CM | POA: Insufficient documentation

## 2018-08-21 DIAGNOSIS — N184 Chronic kidney disease, stage 4 (severe): Secondary | ICD-10-CM

## 2018-08-21 HISTORY — DX: Other specified postprocedural states: Z98.890

## 2018-08-21 HISTORY — PX: INSERTION OF DIALYSIS CATHETER: SHX1324

## 2018-08-21 HISTORY — DX: Nausea with vomiting, unspecified: R11.2

## 2018-08-21 HISTORY — DX: Cardiac murmur, unspecified: R01.1

## 2018-08-21 HISTORY — PX: AV FISTULA PLACEMENT: SHX1204

## 2018-08-21 HISTORY — DX: Primary hyperparathyroidism: E21.0

## 2018-08-21 LAB — POCT PREGNANCY, URINE: Preg Test, Ur: NEGATIVE

## 2018-08-21 LAB — POCT I-STAT 4, (NA,K, GLUC, HGB,HCT)
Glucose, Bld: 82 mg/dL (ref 70–99)
HCT: 27 % — ABNORMAL LOW (ref 36.0–46.0)
Hemoglobin: 9.2 g/dL — ABNORMAL LOW (ref 12.0–15.0)
Potassium: 4.7 mmol/L (ref 3.5–5.1)
Sodium: 136 mmol/L (ref 135–145)

## 2018-08-21 SURGERY — INSERTION OF DIALYSIS CATHETER
Anesthesia: General | Site: Arm Upper

## 2018-08-21 MED ORDER — SODIUM CHLORIDE 0.9 % IV SOLN
INTRAVENOUS | Status: DC | PRN
  Administered 2018-08-21: 13:00:00

## 2018-08-21 MED ORDER — HEPARIN SODIUM (PORCINE) 1000 UNIT/ML IJ SOLN
INTRAMUSCULAR | Status: AC
  Filled 2018-08-21: qty 1

## 2018-08-21 MED ORDER — VANCOMYCIN HCL IN DEXTROSE 1-5 GM/200ML-% IV SOLN
1000.0000 mg | INTRAVENOUS | Status: AC
  Administered 2018-08-21: 1000 mg via INTRAVENOUS

## 2018-08-21 MED ORDER — SODIUM CHLORIDE 0.9 % IV SOLN
INTRAVENOUS | Status: AC
  Filled 2018-08-21: qty 1.2

## 2018-08-21 MED ORDER — ONDANSETRON HCL 4 MG/2ML IJ SOLN
INTRAMUSCULAR | Status: DC | PRN
  Administered 2018-08-21: 4 mg via INTRAVENOUS

## 2018-08-21 MED ORDER — OXYCODONE HCL 5 MG PO TABS
5.0000 mg | ORAL_TABLET | Freq: Once | ORAL | Status: AC | PRN
  Administered 2018-08-21: 5 mg via ORAL

## 2018-08-21 MED ORDER — HEPARIN SODIUM (PORCINE) 1000 UNIT/ML IJ SOLN
INTRAMUSCULAR | Status: DC | PRN
  Administered 2018-08-21: 3400 [IU]

## 2018-08-21 MED ORDER — MIDAZOLAM HCL 2 MG/2ML IJ SOLN
INTRAMUSCULAR | Status: AC
  Filled 2018-08-21: qty 2

## 2018-08-21 MED ORDER — 0.9 % SODIUM CHLORIDE (POUR BTL) OPTIME
TOPICAL | Status: DC | PRN
  Administered 2018-08-21: 1000 mL

## 2018-08-21 MED ORDER — PROPOFOL 10 MG/ML IV BOLUS
INTRAVENOUS | Status: AC
  Filled 2018-08-21: qty 20

## 2018-08-21 MED ORDER — ONDANSETRON HCL 4 MG/2ML IJ SOLN
INTRAMUSCULAR | Status: AC
  Filled 2018-08-21: qty 4

## 2018-08-21 MED ORDER — MIDAZOLAM HCL 5 MG/5ML IJ SOLN
INTRAMUSCULAR | Status: DC | PRN
  Administered 2018-08-21: 2 mg via INTRAVENOUS

## 2018-08-21 MED ORDER — OXYCODONE HCL 5 MG PO TABS: 5.0000 mg | ORAL_TABLET | Freq: Four times a day (QID) | ORAL | 0 refills | Status: DC | PRN

## 2018-08-21 MED ORDER — PROPOFOL 10 MG/ML IV BOLUS
INTRAVENOUS | Status: DC | PRN
  Administered 2018-08-21: 170 mg via INTRAVENOUS
  Administered 2018-08-21 (×2): 30 mg via INTRAVENOUS

## 2018-08-21 MED ORDER — CHLORHEXIDINE GLUCONATE 4 % EX LIQD: 60.0000 mL | Freq: Once | CUTANEOUS | Status: DC

## 2018-08-21 MED ORDER — LIDOCAINE 2% (20 MG/ML) 5 ML SYRINGE
INTRAMUSCULAR | Status: DC | PRN
  Administered 2018-08-21: 60 mg via INTRAVENOUS

## 2018-08-21 MED ORDER — OXYCODONE HCL 5 MG/5ML PO SOLN: 5.0000 mg | Freq: Once | ORAL | Status: AC | PRN

## 2018-08-21 MED ORDER — FENTANYL CITRATE (PF) 100 MCG/2ML IJ SOLN
INTRAMUSCULAR | Status: AC
  Filled 2018-08-21: qty 2

## 2018-08-21 MED ORDER — PHENYLEPHRINE 40 MCG/ML (10ML) SYRINGE FOR IV PUSH (FOR BLOOD PRESSURE SUPPORT)
PREFILLED_SYRINGE | INTRAVENOUS | Status: AC
  Filled 2018-08-21: qty 10

## 2018-08-21 MED ORDER — SODIUM CHLORIDE 0.9 % IV SOLN
INTRAVENOUS | Status: DC
  Administered 2018-08-21 (×2): via INTRAVENOUS

## 2018-08-21 MED ORDER — VANCOMYCIN HCL IN DEXTROSE 1-5 GM/200ML-% IV SOLN
INTRAVENOUS | Status: AC
  Administered 2018-08-21: 1000 mg via INTRAVENOUS
  Filled 2018-08-21: qty 200

## 2018-08-21 MED ORDER — OXYCODONE HCL 5 MG PO TABS
ORAL_TABLET | ORAL | Status: AC
  Filled 2018-08-21: qty 1

## 2018-08-21 MED ORDER — FENTANYL CITRATE (PF) 250 MCG/5ML IJ SOLN
INTRAMUSCULAR | Status: AC
  Filled 2018-08-21: qty 5

## 2018-08-21 MED ORDER — DEXAMETHASONE SODIUM PHOSPHATE 4 MG/ML IJ SOLN
INTRAMUSCULAR | Status: DC | PRN
  Administered 2018-08-21: 10 mg via INTRAVENOUS

## 2018-08-21 MED ORDER — FENTANYL CITRATE (PF) 100 MCG/2ML IJ SOLN
INTRAMUSCULAR | Status: DC | PRN
  Administered 2018-08-21: 50 ug via INTRAVENOUS
  Administered 2018-08-21: 25 ug via INTRAVENOUS
  Administered 2018-08-21: 50 ug via INTRAVENOUS
  Administered 2018-08-21 (×2): 25 ug via INTRAVENOUS
  Administered 2018-08-21: 50 ug via INTRAVENOUS
  Administered 2018-08-21: 25 ug via INTRAVENOUS

## 2018-08-21 MED ORDER — FENTANYL CITRATE (PF) 100 MCG/2ML IJ SOLN
25.0000 ug | INTRAMUSCULAR | Status: DC | PRN
  Administered 2018-08-21: 25 ug via INTRAVENOUS

## 2018-08-21 MED ORDER — ONDANSETRON HCL 4 MG/2ML IJ SOLN: 4.0000 mg | Freq: Once | INTRAMUSCULAR | Status: DC | PRN

## 2018-08-21 MED ORDER — PHENYLEPHRINE 40 MCG/ML (10ML) SYRINGE FOR IV PUSH (FOR BLOOD PRESSURE SUPPORT)
PREFILLED_SYRINGE | INTRAVENOUS | Status: DC | PRN
  Administered 2018-08-21 (×2): 80 ug via INTRAVENOUS

## 2018-08-21 MED ORDER — DEXAMETHASONE SODIUM PHOSPHATE 10 MG/ML IJ SOLN
INTRAMUSCULAR | Status: AC
  Filled 2018-08-21: qty 2

## 2018-08-21 SURGICAL SUPPLY — 50 items
ARMBAND PINK RESTRICT EXTREMIT (MISCELLANEOUS) ×6 IMPLANT
BAG DECANTER FOR FLEXI CONT (MISCELLANEOUS) ×3 IMPLANT
BIOPATCH RED 1 DISK 7.0 (GAUZE/BANDAGES/DRESSINGS) ×3 IMPLANT
CANISTER SUCT 3000ML PPV (MISCELLANEOUS) ×3 IMPLANT
CANNULA VESSEL 3MM 2 BLNT TIP (CANNULA) ×3 IMPLANT
CATH PALINDROME RT-P 15FX19CM (CATHETERS) IMPLANT
CATH PALINDROME RT-P 15FX23CM (CATHETERS) ×3 IMPLANT
CATH PALINDROME RT-P 15FX28CM (CATHETERS) IMPLANT
CATH PALINDROME RT-P 15FX55CM (CATHETERS) IMPLANT
CLIP LIGATING EXTRA MED SLVR (CLIP) ×3 IMPLANT
CLIP LIGATING EXTRA SM BLUE (MISCELLANEOUS) ×3 IMPLANT
COVER PROBE W GEL 5X96 (DRAPES) ×3 IMPLANT
COVER SURGICAL LIGHT HANDLE (MISCELLANEOUS) ×3 IMPLANT
COVER WAND RF STERILE (DRAPES) ×3 IMPLANT
DECANTER SPIKE VIAL GLASS SM (MISCELLANEOUS) ×3 IMPLANT
DERMABOND ADVANCED (GAUZE/BANDAGES/DRESSINGS) ×3
DERMABOND ADVANCED .7 DNX12 (GAUZE/BANDAGES/DRESSINGS) ×6 IMPLANT
DRAPE C-ARM 42X72 X-RAY (DRAPES) ×3 IMPLANT
DRAPE CHEST BREAST 15X10 FENES (DRAPES) ×3 IMPLANT
DRSG COVADERM 4X6 (GAUZE/BANDAGES/DRESSINGS) ×3 IMPLANT
ELECT REM PT RETURN 9FT ADLT (ELECTROSURGICAL) ×3
ELECTRODE REM PT RTRN 9FT ADLT (ELECTROSURGICAL) ×2 IMPLANT
GAUZE 4X4 16PLY RFD (DISPOSABLE) ×3 IMPLANT
GLOVE SS BIOGEL STRL SZ 7.5 (GLOVE) ×2 IMPLANT
GLOVE SUPERSENSE BIOGEL SZ 7.5 (GLOVE) ×1
GOWN STRL REUS W/ TWL LRG LVL3 (GOWN DISPOSABLE) ×6 IMPLANT
GOWN STRL REUS W/TWL LRG LVL3 (GOWN DISPOSABLE) ×3
KIT BASIN OR (CUSTOM PROCEDURE TRAY) ×3 IMPLANT
KIT TURNOVER KIT B (KITS) ×3 IMPLANT
NEEDLE 18GX1X1/2 (RX/OR ONLY) (NEEDLE) ×3 IMPLANT
NEEDLE 22X1 1/2 (OR ONLY) (NEEDLE) IMPLANT
NEEDLE HYPO 25GX1X1/2 BEV (NEEDLE) ×3 IMPLANT
NS IRRIG 1000ML POUR BTL (IV SOLUTION) ×3 IMPLANT
PACK CV ACCESS (CUSTOM PROCEDURE TRAY) ×3 IMPLANT
PACK SURGICAL SETUP 50X90 (CUSTOM PROCEDURE TRAY) ×3 IMPLANT
PAD ARMBOARD 7.5X6 YLW CONV (MISCELLANEOUS) ×6 IMPLANT
SOAP 2 % CHG 4 OZ (WOUND CARE) ×3 IMPLANT
SUT ETHILON 3 0 PS 1 (SUTURE) ×3 IMPLANT
SUT PROLENE 6 0 CC (SUTURE) ×3 IMPLANT
SUT VIC AB 3-0 SH 27 (SUTURE) ×2
SUT VIC AB 3-0 SH 27X BRD (SUTURE) ×4 IMPLANT
SUT VICRYL 4-0 PS2 18IN ABS (SUTURE) ×3 IMPLANT
SYR 10ML LL (SYRINGE) ×3 IMPLANT
SYR 20CC LL (SYRINGE) ×3 IMPLANT
SYR 5ML LL (SYRINGE) ×6 IMPLANT
SYR CONTROL 10ML LL (SYRINGE) ×3 IMPLANT
TOWEL GREEN STERILE (TOWEL DISPOSABLE) ×6 IMPLANT
TOWEL GREEN STERILE FF (TOWEL DISPOSABLE) ×3 IMPLANT
UNDERPAD 30X30 (UNDERPADS AND DIAPERS) ×3 IMPLANT
WATER STERILE IRR 1000ML POUR (IV SOLUTION) ×3 IMPLANT

## 2018-08-21 NOTE — Anesthesia Postprocedure Evaluation (Signed)
Anesthesia Post Note  Patient: Sharon [name]  Procedure(s) Performed: INSERTION OFTUNNELED  DIALYSIS CATHETER (N/A ) CREATION OF BRACHIOBASILIC ARTERIOVENOUS FISTULA  LEFT ARM (Left Arm Upper)     Patient location during evaluation: PACU Anesthesia Type: General Level of consciousness: awake and alert Pain management: pain level controlled Vital Signs Assessment: post-procedure vital signs reviewed and stable Respiratory status: spontaneous breathing, nonlabored ventilation, respiratory function stable and patient connected to nasal cannula oxygen Cardiovascular status: blood pressure returned to baseline and stable Postop Assessment: no apparent nausea or vomiting Anesthetic complications: no    Last Vitals:  Vitals:   08/21/18 1445 08/21/18 1504  BP:  (!) 149/92  Pulse: 61 68  Resp: 13 14  Temp:  (!) 36.3 C  SpO2: 100% 100%    Last Pain:  Vitals:   08/21/18 1504  TempSrc:   PainSc: 5                  [name] COKER

## 2018-08-21 NOTE — H&P (Signed)
   Patient name: Sharon [name] MRN: [id number] DOB: 1975/06/26 Sex: female    HPI: Sharon [name] is a 43 y.o. female a history of chronic renal insufficiency.  Now has progressed to end-stage renal disease.  Is here today for tunneled hemodialysis catheter.  She had had prior left brachiocephalic fistula which failed.  We had discussed potential left brachiobasilic fistula and she would wish to defer.  Place a hemodialysis catheter today and plan permanent access as an outpatient  Current Facility-Administered Medications  Medication Dose Route Frequency Provider Last Rate Last Dose  . 0.9 %  sodium chloride infusion   Intravenous Continuous Hermenegildo Clausen, Arvilla Meres, MD      . chlorhexidine (HIBICLENS) 4 % liquid 4 application  60 mL Topical Once Linn Clavin, Arvilla Meres, MD       And  . Derrill Memo ON 08/22/2018] chlorhexidine (HIBICLENS) 4 % liquid 4 application  60 mL Topical Once Taneia Mealor, Arvilla Meres, MD      . vancomycin (VANCOCIN) 1-5 GM/200ML-% IVPB           . vancomycin (VANCOCIN) IVPB 1000 mg/200 mL premix  1,000 mg Intravenous 60 min Pre-Op Arya Boxley, Arvilla Meres, MD         PHYSICAL EXAM: Vitals:   08/21/18 0823  BP: (!) 151/94  Pulse: 81  Resp: 18  Temp: 98.4 F (36.9 C)  TempSrc: Oral  SpO2: 100%  Weight: 71.7 kg  Height: 5\' 8"  (1.727 m)    GENERAL: The patient is a well-nourished female, in no acute distress. The vital signs are documented above. Palpable radial pulses bilaterally  MEDICAL ISSUES: Progression to end-stage renal disease for catheter placement today for hemodialysis.  Procedure and risks discussed with the patient who understands and wishes to proceed   Rosetta Posner, MD [facility] Tel [phone] Pager [phone]

## 2018-08-21 NOTE — Anesthesia Preprocedure Evaluation (Signed)
Anesthesia Evaluation  Patient identified by MRN, date of birth, ID band Patient awake    Reviewed: Allergy & Precautions, NPO status , Patient's Chart, lab work & pertinent test results  Airway Mallampati: II  TM Distance: >3 FB Neck ROM: Full    Dental  (+) Teeth Intact, Dental Advisory Given   Pulmonary    breath sounds clear to auscultation       Cardiovascular hypertension,  Rhythm:Regular Rate:Normal     Neuro/Psych    GI/Hepatic   Endo/Other    Renal/GU      Musculoskeletal   Abdominal   Peds  Hematology   Anesthesia Other Findings   Reproductive/Obstetrics                             Anesthesia Physical Anesthesia Plan  ASA: III  Anesthesia Plan: General   Post-op Pain Management:    Induction: Intravenous  PONV Risk Score and Plan: Ondansetron and Dexamethasone  Airway Management Planned: LMA  Additional Equipment:   Intra-op Plan:   Post-operative Plan:   Informed Consent: I have reviewed the patients History and Physical, chart, labs and discussed the procedure including the risks, benefits and alternatives for the proposed anesthesia with the patient or authorized representative who has indicated his/her understanding and acceptance.     Dental advisory given  Plan Discussed with: Anesthesiologist and CRNA  Anesthesia Plan Comments:         Anesthesia Quick Evaluation

## 2018-08-21 NOTE — Anesthesia Procedure Notes (Signed)
Procedure Name: LMA Insertion Date/Time: 08/21/2018 12:03 PM Performed by: Candis Shine, CRNA Pre-anesthesia Checklist: Patient identified, Emergency Drugs available, Suction available and Patient being monitored Patient Re-evaluated:Patient Re-evaluated prior to induction Oxygen Delivery Method: Circle System Utilized Preoxygenation: Pre-oxygenation with 100% oxygen Induction Type: IV induction LMA: LMA inserted LMA Size: 4.0 Number of attempts: 1 Placement Confirmation: positive ETCO2 Tube secured with: Tape Dental Injury: Teeth and Oropharynx as per pre-operative assessment

## 2018-08-21 NOTE — Discharge Instructions (Signed)
° °  [facility]  Discharge Instructions  AV Fistula or Graft Surgery for Dialysis Access  Please refer to the following instructions for your post-procedure care. Your surgeon or physician assistant will discuss any changes with you.  Activity  You may drive the day following your surgery, if you are comfortable and no longer taking prescription pain medication. Resume full activity as the soreness in your incision resolves.  Bathing/Showering  You may shower after you go home. Keep your incision dry for 48 hours. Do not soak in a bathtub, hot tub, or swim until the incision heals completely. You may not shower if you have a hemodialysis catheter.  Incision Care  Clean your incision with mild soap and water after 48 hours. Pat the area dry with a clean towel. You do not need a bandage unless otherwise instructed. Do not apply any ointments or creams to your incision. You may have skin glue on your incision. Do not peel it off. It will come off on its own in about one week. Your arm may swell a bit after surgery. To reduce swelling use pillows to elevate your arm so it is above your heart. Your doctor will tell you if you need to lightly wrap your arm with an ACE bandage.  Diet  Resume your normal diet. There are not special food restrictions following this procedure. In order to heal from your surgery, it is CRITICAL to get adequate nutrition. Your body requires vitamins, minerals, and protein. Vegetables are the best source of vitamins and minerals. Vegetables also provide the perfect balance of protein. Processed food has little nutritional value, so try to avoid this.  Medications  Resume taking all of your medications. If your incision is causing pain, you may take over-the counter pain relievers such as acetaminophen (Tylenol). If you were prescribed a stronger pain medication, please be aware these medications can cause nausea and constipation. Prevent  nausea by taking the medication with a snack or meal. Avoid constipation by drinking plenty of fluids and eating foods with high amount of fiber, such as fruits, vegetables, and grains.  Do not take Tylenol if you are taking prescription pain medications.  Follow up Your surgeon may want to see you in the office following your access surgery. If so, this will be arranged at the time of your surgery.  Please call us immediately for any of the following conditions:  Increased pain, redness, drainage (pus) from your incision site Fever of 101 degrees or higher Severe or worsening pain at your incision site Hand pain or numbness.  Reduce your risk of vascular disease:  Stop smoking. If you would like help, call [facility] at 1-800-QUIT-NOW 316-377-6722) or [facility] [place] your cholesterol Maintain a desired weight Control your diabetes Keep your blood pressure down  Dialysis  It will take several weeks to several months for your new dialysis access to be ready for use. Your surgeon will determine when it is okay to use it. Your nephrologist will continue to direct your dialysis. You can continue to use your Permcath until your new access is ready for use.   08/21/2018 Sharon [name] [id number] Nov 26, 1975  Surgeon(s): Early, Arvilla Meres, MD  Procedure(s): INSERTION OFTUNNELED  DIALYSIS CATHETER Creation of first stage basilic vein transposition  x Do not stick fistula for 12 weeks    If you have any questions, please call the office at [phone].

## 2018-08-21 NOTE — Transfer of Care (Signed)
Immediate Anesthesia Transfer of Care Note  Patient: Sharon [name]  Procedure(s) Performed: INSERTION OFTUNNELED  DIALYSIS CATHETER (N/A ) CREATION OF BRACHIOBASILIC ARTERIOVENOUS FISTULA  LEFT ARM (Left Arm Upper)  Patient Location: PACU  Anesthesia Type:General  Level of Consciousness: awake, alert  and oriented  Airway & Oxygen Therapy: Patient Spontanous Breathing and Patient connected to face mask oxygen  Post-op Assessment: Report given to RN and Post -op Vital signs reviewed and stable  Post vital signs: Reviewed and stable  Last Vitals:  Vitals Value Taken Time  BP 120/62 08/21/2018  2:17 PM  Temp    Pulse 80 08/21/2018  2:18 PM  Resp 13 08/21/2018  2:18 PM  SpO2 100 % 08/21/2018  2:18 PM  Vitals shown include unvalidated device data.  Last Pain:  Vitals:   08/21/18 0827  TempSrc:   PainSc: 0-No pain      Patients Stated Pain Goal: 2 (47/99/87 2158)  Complications: No apparent anesthesia complications

## 2018-08-21 NOTE — Op Note (Signed)
° ° °  OPERATIVE REPORT  DATE OF SURGERY: 08/21/2018  PATIENT: Sharon [name], 43 y.o. female MRN: [id number]  DOB: 1975/05/14  PRE-OPERATIVE DIAGNOSIS: End-stage renal disease  POST-OPERATIVE DIAGNOSIS:  Same  PROCEDURE: #1 right IJ tunneled hemodialysis catheter, #2 left brachiobasilic fistula  SURGEON:  Curt Jews, M.D.  PHYSICIAN ASSISTANT: Liana Crocker, PA-C  ANESTHESIA: LMA  EBL: per anesthesia record  Total I/O In: 800 [I.V.:800] Out: 20 [Blood:20]  BLOOD ADMINISTERED: none  DRAINS: none  SPECIMEN: none  COUNTS CORRECT:  YES  PATIENT DISPOSITION:  PACU - hemodynamically stable  PROCEDURE DETAILS: Patient was taken the operating placed supine position where the area the right and left neck and chest prepped draped you sterile fashion.  Using SonoSite visualization the right internal jugular vein was accessed with the patient in Trendelenburg position.  Guidewire was passed on the level of the right atrium and this was confirmed with fluoroscopy.  Dilator was passed over the guidewire and then the dilator and peel-away sheath was passed over the guidewire.  Dilator and wire removed and the catheter was positioned through the peel-away sheath.  The peel-away sheath was then removed.  The catheter tips were positioned the level of the distal right atrium.  The catheter was then brought through a subcutaneous tunnel through a separate stab incision.  2 lm ports were attached and both lumens flushed and aspirated easily and were locked with 1000/cc heparin.  The catheter was secured to the skin with a 3-0 nylon stitch and the entry site was closed with a 4 subcuticular Vicryl stitch.  All dressing was applied.  Attention was then turned to the left arm.  The patient had a prior brachiocephalic fistula which thrombosed.  The arm was imaged with SonoSite and this did show good caliber basilic vein.  Incision was made over the basilic vein on the medial aspect of the elbow and  tributary branches were ligated with 3-0 silk ties and divided.  The brachial artery was exposed to a separate incision in the antecubital space.  The old anastomosis was exposed and the basilic vein was ligated and divided and mobilized to the level of the brachial artery through a subcutaneous tunnel.  The artery was occluded proximal distal to the old anastomosis and the old cephalic vein anastomosis was taken down.  The basilic vein was cut to the appropriate length and was spatulated and sewn end-to-side to the artery with a running 6-0 Prolene suture.  Clamps removed and excellent thrill was noted.  Wounds irrigated with saline.  Hemostasis electrocautery.  Wounds were closed with 3-0 Vicryl in the subcutaneous and subcuticular tissue.  Sterile dressing was applied and the patient was transferred to the recovery room in stable condition with chest x-ray pending   Sharon [name], M.D., [facility] 08/21/2018 2:31 PM

## 2018-08-22 ENCOUNTER — Encounter (HOSPITAL_COMMUNITY): Payer: Self-pay | Admitting: Vascular Surgery

## 2018-08-23 ENCOUNTER — Telehealth: Payer: Self-pay | Admitting: *Deleted

## 2018-08-23 ENCOUNTER — Other Ambulatory Visit: Payer: Self-pay

## 2018-08-23 ENCOUNTER — Ambulatory Visit (INDEPENDENT_AMBULATORY_CARE_PROVIDER_SITE_OTHER): Payer: Self-pay | Admitting: Vascular Surgery

## 2018-08-23 ENCOUNTER — Encounter: Payer: Self-pay | Admitting: Vascular Surgery

## 2018-08-23 VITALS — BP 120/83 | HR 110 | Resp 20 | Ht 68.0 in | Wt 158.0 lb

## 2018-08-23 DIAGNOSIS — N186 End stage renal disease: Secondary | ICD-10-CM

## 2018-08-23 DIAGNOSIS — Z992 Dependence on renal dialysis: Secondary | ICD-10-CM

## 2018-08-23 MED ORDER — OXYCODONE-ACETAMINOPHEN 5-325 MG PO TABS: 1.0000 | ORAL_TABLET | ORAL | 0 refills | Status: DC | PRN

## 2018-08-23 NOTE — Telephone Encounter (Signed)
Call from patient requesting pain medication. States she only has one left from Rx of Oxy IR #8 prescribed. Denies any heat or significant swelling at left arm. Bruising present. Reminded to keep arm elevated above level of heart. Claim neck incision is very painful, "pulling and can hardly move my neck". She states pain is 5-6/10 AFTER pain pill. Discussed coming is to see doctor today to assess arm and neck and discuss poor  op pain relief with current medication. Agreeable to appointment today. Appt. Desk will call with time.

## 2018-08-23 NOTE — Progress Notes (Signed)
Patient name: Sharon [name] MRN: [id number] DOB: [birth year] Sex: female  REASON FOR VISIT:   Follow-up  HPI:   Sharon [name] is a pleasant 43 y.o. female who underwent a left brachial basilic fistula and placement of a tunneled dialysis catheter by Dr. Sherren Mocha Early 2 days ago.  She comes in today complaining of pain around the catheter site and also at her incisions in the left arm.  She denies fever or chills.  She has not yet started dialysis.  She has no pain in her left hand or symptoms to suggest steal.  Current Outpatient Medications  Medication Sig Dispense Refill  . acetaminophen (TYLENOL) 500 MG tablet Take 500-1,000 mg by mouth every 6 (six) hours as needed for moderate pain or headache.    Marland Kitchen amLODipine (NORVASC) 5 MG tablet Take 5 mg by mouth at bedtime.     . calcitRIOL (ROCALTROL) 0.5 MCG capsule Take 0.5 mcg by mouth every other day.     Marland Kitchen Epoetin Alfa-epbx (RETACRIT IJ) Inject 30,000 Units into the skin every 21 ( twenty-one) days.     . hydrochlorothiazide (HYDRODIURIL) 25 MG tablet Take 25 mg by mouth every morning.     . loratadine (CLARITIN) 10 MG tablet Take 10 mg by mouth daily as needed for allergies.    Marland Kitchen oxyCODONE (ROXICODONE) 5 MG immediate release tablet Take 1 tablet (5 mg total) by mouth every 6 (six) hours as needed. 8 tablet 0  . potassium chloride SA (K-DUR,KLOR-CON) 20 MEQ tablet Take 20 mEq by mouth at bedtime.     . sodium bicarbonate 650 MG tablet Take 1,300 mg by mouth 2 (two) times daily.    Marland Kitchen telmisartan (MICARDIS) 80 MG tablet Take 80 mg by mouth at bedtime.     . Tolvaptan (JYNARQUE) 90 & 30 MG TBPK Take 30-90 mg by mouth See admin instructions. Takes 90 mg by mouth in the morning and 30 mg in the evening     No current facility-administered medications for this visit.     REVIEW OF SYSTEMS:  [X]  denotes positive finding, [ ]  denotes negative finding Vascular    Leg swelling    Cardiac    Chest pain or chest pressure:    Shortness of breath  upon exertion:    Short of breath when lying flat:    Irregular heart rhythm:    Constitutional    Fever or chills:     PHYSICAL EXAM:   Vitals:   08/23/18 1314  BP: 120/83  Pulse: (!) 110  Resp: 20  SpO2: 97%  Weight: 158 lb (71.7 kg)  Height: 5\' 8"  (1.727 m)    GENERAL: The patient is a well-nourished female, in no acute distress. The vital signs are documented above. CARDIOVASCULAR: There is a regular rate and rhythm. PULMONARY: There is good air exchange bilaterally without wheezing or rales. She has a good thrill in her upper arm fistula on the left.  Her incisions look fine.  There is no evidence of swelling or hematoma. She has a palpable left radial pulse. Her catheter site looks fine.  DATA:   No new data  MEDICAL ISSUES:   STATUS POST LEFT BRACHIAL BASILIC FISTULA AND TUNNELED DIALYSIS CATHETER: She has no evidence of infection or hematoma in any of her incisions.  I think she simply having incisional pain.  I have sent her a prescription for 15 more Percocet to her pharmacy as she states that she only had 1 pill left.  She  will keep her regularly scheduled appointment next month.  Deitra Mayo [facility] [phone]

## 2018-08-28 ENCOUNTER — Telehealth: Payer: Self-pay | Admitting: Vascular Surgery

## 2018-08-28 NOTE — Telephone Encounter (Signed)
-----   Message from Rosetta Posner, MD sent at 08/21/2018  2:34 PM EDT -----  Tunneled hemodialysis catheter and left brachiobasilic for stage fistula.  Assistant Liana Crocker

## 2018-08-28 NOTE — Telephone Encounter (Signed)
sch appt spk to pt mld ltr 10/03/2018 8am Dialysis Duplex 840am p/o MD

## 2018-09-26 ENCOUNTER — Other Ambulatory Visit: Payer: Self-pay

## 2018-09-26 DIAGNOSIS — Z992 Dependence on renal dialysis: Secondary | ICD-10-CM

## 2018-09-26 DIAGNOSIS — N186 End stage renal disease: Secondary | ICD-10-CM

## 2018-10-03 ENCOUNTER — Ambulatory Visit (HOSPITAL_COMMUNITY)
Admission: RE | Admit: 2018-10-03 | Discharge: 2018-10-03 | Disposition: A | Discharge status: Home / Self Care | Payer: PRIVATE HEALTH INSURANCE | Source: Ambulatory Visit | Attending: Family | Admitting: Family

## 2018-10-03 ENCOUNTER — Other Ambulatory Visit: Payer: Self-pay

## 2018-10-03 ENCOUNTER — Ambulatory Visit (INDEPENDENT_AMBULATORY_CARE_PROVIDER_SITE_OTHER): Payer: Self-pay | Admitting: Vascular Surgery

## 2018-10-03 ENCOUNTER — Encounter: Payer: Self-pay | Admitting: Vascular Surgery

## 2018-10-03 VITALS — BP 98/71 | HR 88 | Temp 97.4°F | Resp 18 | Ht 68.0 in | Wt 158.0 lb

## 2018-10-03 DIAGNOSIS — N186 End stage renal disease: Secondary | ICD-10-CM

## 2018-10-03 DIAGNOSIS — Z992 Dependence on renal dialysis: Secondary | ICD-10-CM

## 2018-10-03 NOTE — H&P (View-Only) (Signed)
   Patient name: Sharon [name] MRN: [id number] DOB: 1975-08-06 Sex: female  REASON FOR VISIT: Follow-up left brachiobasilic fistula for stage on 08/22/2018  HPI: Sharon [name] is a 43 y.o. female for follow-up.  She currently dialyzes via right IJ catheter.  She had for stage brachiobasilic fistula on 0/94/0768.  She has had excellent maturation of this.  She has had a Vicryl stitch abscess in the area at her antecubital incision for vein harvest not at the anastomosis incision.  Been some drainage from this and I reassured her that this was superficial and will resolve as the Vicryl suture dissolves.  Current Outpatient Medications  Medication Sig Dispense Refill  . acetaminophen (TYLENOL) 500 MG tablet Take 500-1,000 mg by mouth every 6 (six) hours as needed for moderate pain or headache.    Marland Kitchen amLODipine (NORVASC) 5 MG tablet Take 5 mg by mouth at bedtime.     Marland Kitchen Epoetin Alfa-epbx (RETACRIT IJ) Inject 30,000 Units into the skin every 21 ( twenty-one) days.     Marland Kitchen loratadine (CLARITIN) 10 MG tablet Take 10 mg by mouth daily as needed for allergies.    . multivitamin (RENA-VIT) TABS tablet Take 1 tablet by mouth daily.    . Nutritional Supplements (VITAMIN D BOOSTER PO) Take by mouth.    . telmisartan (MICARDIS) 80 MG tablet Take 80 mg by mouth at bedtime.     . calcitRIOL (ROCALTROL) 0.5 MCG capsule Take 0.5 mcg by mouth every other day.     . hydrochlorothiazide (HYDRODIURIL) 25 MG tablet Take 25 mg by mouth every morning.     . potassium chloride SA (K-DUR,KLOR-CON) 20 MEQ tablet Take 20 mEq by mouth at bedtime.     . sodium bicarbonate 650 MG tablet Take 1,300 mg by mouth 2 (two) times daily.    . Tolvaptan (JYNARQUE) 90 & 30 MG TBPK Take 30-90 mg by mouth See admin instructions. Takes 90 mg by mouth in the morning and 30 mg in the evening     No current facility-administered medications for this visit.      PHYSICAL EXAM: Vitals:   10/03/18 0855  BP:  98/71  Pulse: 88  Resp: 18  Temp: (!) 97.4 F (36.3 C)  SpO2: 98%  Weight: 158 lb (71.7 kg)  Height: 5\' 8"  (1.727 m)    GENERAL: The patient is a well-nourished female, in no acute distress. The vital signs are documented above. Excellent thrill with no evidence of steal in her left hand.  Does have a slight opening of the upper part of her incision with no purulence.  Duplex shows excellent maturation with diameter ranging from 7-1/2-8 throughout its course  MEDICAL ISSUES: Excellent Peg Fifer maturation of her fistula.  Will plan second stage transposition.  Discussed the procedure in detail with the patient who understands this will be done as an outpatient.  She has dialysis on Monday Wednesday and Friday so we will coordinate this on a nondialysis day within the next 1 or 2 weeks   Rosetta Posner, MD [facility] of [facility] Tel [phone] Pager [phone]

## 2018-10-03 NOTE — Progress Notes (Signed)
   Patient name: Sharon [name] MRN: [id number] DOB: 05/19/1975 Sex: female  REASON FOR VISIT: Follow-up left brachiobasilic fistula for stage on 08/22/2018  HPI: Sharon [name] is a 43 y.o. female for follow-up.  She currently dialyzes via right IJ catheter.  She had for stage brachiobasilic fistula on 08/17/9530.  She has had excellent maturation of this.  She has had a Vicryl stitch abscess in the area at her antecubital incision for vein harvest not at the anastomosis incision.  Been some drainage from this and I reassured her that this was superficial and will resolve as the Vicryl suture dissolves.  Current Outpatient Medications  Medication Sig Dispense Refill  . acetaminophen (TYLENOL) 500 MG tablet Take 500-1,000 mg by mouth every 6 (six) hours as needed for moderate pain or headache.    Marland Kitchen amLODipine (NORVASC) 5 MG tablet Take 5 mg by mouth at bedtime.     Marland Kitchen Epoetin Alfa-epbx (RETACRIT IJ) Inject 30,000 Units into the skin every 21 ( twenty-one) days.     Marland Kitchen loratadine (CLARITIN) 10 MG tablet Take 10 mg by mouth daily as needed for allergies.    . multivitamin (RENA-VIT) TABS tablet Take 1 tablet by mouth daily.    . Nutritional Supplements (VITAMIN D BOOSTER PO) Take by mouth.    . telmisartan (MICARDIS) 80 MG tablet Take 80 mg by mouth at bedtime.     . calcitRIOL (ROCALTROL) 0.5 MCG capsule Take 0.5 mcg by mouth every other day.     . hydrochlorothiazide (HYDRODIURIL) 25 MG tablet Take 25 mg by mouth every morning.     . potassium chloride SA (K-DUR,KLOR-CON) 20 MEQ tablet Take 20 mEq by mouth at bedtime.     . sodium bicarbonate 650 MG tablet Take 1,300 mg by mouth 2 (two) times daily.    . Tolvaptan (JYNARQUE) 90 & 30 MG TBPK Take 30-90 mg by mouth See admin instructions. Takes 90 mg by mouth in the morning and 30 mg in the evening     No current facility-administered medications for this visit.      PHYSICAL EXAM: Vitals:   10/03/18 0855  BP:  98/71  Pulse: 88  Resp: 18  Temp: (!) 97.4 F (36.3 C)  SpO2: 98%  Weight: 158 lb (71.7 kg)  Height: 5\' 8"  (1.727 m)    GENERAL: The patient is a well-nourished female, in no acute distress. The vital signs are documented above. Excellent thrill with no evidence of steal in her left hand.  Does have a slight opening of the upper part of her incision with no purulence.  Duplex shows excellent maturation with diameter ranging from 7-1/2-8 throughout its course  MEDICAL ISSUES: Excellent Errol Ala maturation of her fistula.  Will plan second stage transposition.  Discussed the procedure in detail with the patient who understands this will be done as an outpatient.  She has dialysis on Monday Wednesday and Friday so we will coordinate this on a nondialysis day within the next 1 or 2 weeks   Rosetta Posner, MD [facility] Special[facility] of [facility] Tel [phone] Pager [phone]

## 2018-10-09 ENCOUNTER — Other Ambulatory Visit (HOSPITAL_COMMUNITY)
Admission: RE | Admit: 2018-10-09 | Discharge: 2018-10-09 | Disposition: A | Discharge status: Home / Self Care | Payer: PRIVATE HEALTH INSURANCE | Source: Ambulatory Visit | Attending: Vascular Surgery | Admitting: Vascular Surgery

## 2018-10-09 DIAGNOSIS — Z1159 Encounter for screening for other viral diseases: Secondary | ICD-10-CM | POA: Diagnosis present

## 2018-10-10 LAB — NOVEL CORONAVIRUS, NAA (HOSP ORDER, SEND-OUT TO REF LAB; TAT 18-24 HRS): SARS-CoV-2, NAA: NOT DETECTED

## 2018-10-11 ENCOUNTER — Other Ambulatory Visit: Payer: Self-pay

## 2018-10-11 ENCOUNTER — Encounter (HOSPITAL_COMMUNITY): Payer: Self-pay | Admitting: *Deleted

## 2018-10-11 NOTE — Progress Notes (Signed)
Pt denies SOB, chest pain, and being under the care of a cardiologist. Pt denies having a cardiac cath. EKG tracing on chart.  Pt made aware to stop taking vitamins, Biotin, fish oil and herbal medications. Do not take any NSAIDs ie: Ibuprofen, Advil, Naproxen (Aleve), Motrin, BC and Goody Powder.  Pt denies that she and family members tested positive for COVID-19 ( negative test result on 10/09/2018 for dialysis pt; pt reminded to quarantine).   Pt denies that she and family members experienced the following symptoms:  Cough yes/no: No Fever (>100.37F)  yes/no: No Runny nose yes/no: No Sore throat yes/no: No Difficulty breathing/shortness of breath  yes/no: No  Have you or a family member traveled in the last 14 days and where? yes/no: No  Pt reminded that hospital visitation restrictions are in effect and the importance of the restrictions.  Pt verbalized understanding of all pre-op instructions.

## 2018-10-12 ENCOUNTER — Ambulatory Visit (HOSPITAL_COMMUNITY)
Admission: RE | Admit: 2018-10-12 | Discharge: 2018-10-12 | Disposition: A | Discharge status: Home / Self Care | Payer: PRIVATE HEALTH INSURANCE | Attending: Vascular Surgery | Admitting: Vascular Surgery

## 2018-10-12 ENCOUNTER — Ambulatory Visit (HOSPITAL_COMMUNITY): Payer: PRIVATE HEALTH INSURANCE | Admitting: Anesthesiology

## 2018-10-12 ENCOUNTER — Encounter (HOSPITAL_COMMUNITY): Payer: Self-pay | Admitting: *Deleted

## 2018-10-12 ENCOUNTER — Encounter (HOSPITAL_COMMUNITY)
Admission: RE | Disposition: A | Discharge status: Home / Self Care | Payer: Self-pay | Source: Home / Self Care | Attending: Vascular Surgery

## 2018-10-12 DIAGNOSIS — N186 End stage renal disease: Secondary | ICD-10-CM | POA: Insufficient documentation

## 2018-10-12 DIAGNOSIS — D631 Anemia in chronic kidney disease: Secondary | ICD-10-CM | POA: Diagnosis not present

## 2018-10-12 DIAGNOSIS — Z992 Dependence on renal dialysis: Secondary | ICD-10-CM | POA: Diagnosis not present

## 2018-10-12 DIAGNOSIS — I120 Hypertensive chronic kidney disease with stage 5 chronic kidney disease or end stage renal disease: Secondary | ICD-10-CM | POA: Diagnosis not present

## 2018-10-12 DIAGNOSIS — Z79899 Other long term (current) drug therapy: Secondary | ICD-10-CM | POA: Diagnosis not present

## 2018-10-12 DIAGNOSIS — R51 Headache: Secondary | ICD-10-CM | POA: Insufficient documentation

## 2018-10-12 DIAGNOSIS — N184 Chronic kidney disease, stage 4 (severe): Secondary | ICD-10-CM

## 2018-10-12 DIAGNOSIS — N185 Chronic kidney disease, stage 5: Secondary | ICD-10-CM

## 2018-10-12 HISTORY — PX: BASCILIC VEIN TRANSPOSITION: SHX5742

## 2018-10-12 LAB — POCT I-STAT 4, (NA,K, GLUC, HGB,HCT)
Glucose, Bld: 87 mg/dL (ref 70–99)
HCT: 33 % — ABNORMAL LOW (ref 36.0–46.0)
Hemoglobin: 11.2 g/dL — ABNORMAL LOW (ref 12.0–15.0)
Potassium: 3.9 mmol/L (ref 3.5–5.1)
Sodium: 135 mmol/L (ref 135–145)

## 2018-10-12 LAB — POCT PREGNANCY, URINE: Preg Test, Ur: NEGATIVE

## 2018-10-12 SURGERY — TRANSPOSITION, VEIN, BASILIC
Anesthesia: General | Site: Arm Upper | Laterality: Left

## 2018-10-12 MED ORDER — SODIUM CHLORIDE 0.9 % IV SOLN
INTRAVENOUS | Status: DC | PRN
  Administered 2018-10-12 (×2): via INTRAVENOUS

## 2018-10-12 MED ORDER — 0.9 % SODIUM CHLORIDE (POUR BTL) OPTIME
TOPICAL | Status: DC | PRN
  Administered 2018-10-12: 1000 mL

## 2018-10-12 MED ORDER — EPHEDRINE SULFATE 50 MG/ML IJ SOLN
INTRAMUSCULAR | Status: DC | PRN
  Administered 2018-10-12: 10 mg via INTRAVENOUS

## 2018-10-12 MED ORDER — PROPOFOL 10 MG/ML IV BOLUS
INTRAVENOUS | Status: AC
  Filled 2018-10-12: qty 20

## 2018-10-12 MED ORDER — LIDOCAINE HCL (CARDIAC) PF 100 MG/5ML IV SOSY
PREFILLED_SYRINGE | INTRAVENOUS | Status: DC | PRN
  Administered 2018-10-12: 40 mg via INTRATRACHEAL

## 2018-10-12 MED ORDER — MIDAZOLAM HCL 2 MG/2ML IJ SOLN
INTRAMUSCULAR | Status: AC
  Filled 2018-10-12: qty 2

## 2018-10-12 MED ORDER — FENTANYL CITRATE (PF) 100 MCG/2ML IJ SOLN
25.0000 ug | INTRAMUSCULAR | Status: DC | PRN
  Administered 2018-10-12: 25 ug via INTRAVENOUS

## 2018-10-12 MED ORDER — EPHEDRINE 5 MG/ML INJ
INTRAVENOUS | Status: AC
  Filled 2018-10-12: qty 10

## 2018-10-12 MED ORDER — DEXAMETHASONE SODIUM PHOSPHATE 10 MG/ML IJ SOLN
INTRAMUSCULAR | Status: AC
  Filled 2018-10-12: qty 1

## 2018-10-12 MED ORDER — CHLORHEXIDINE GLUCONATE 4 % EX LIQD: 60.0000 mL | Freq: Once | CUTANEOUS | Status: DC

## 2018-10-12 MED ORDER — OXYCODONE HCL 5 MG PO TABS: 5.0000 mg | ORAL_TABLET | Freq: Four times a day (QID) | ORAL | 0 refills | Status: DC | PRN

## 2018-10-12 MED ORDER — SODIUM CHLORIDE 0.9 % IV SOLN
INTRAVENOUS | Status: DC
  Administered 2018-10-12: 500 mL via INTRAVENOUS

## 2018-10-12 MED ORDER — VANCOMYCIN HCL IN DEXTROSE 1-5 GM/200ML-% IV SOLN
1000.0000 mg | INTRAVENOUS | Status: AC
  Administered 2018-10-12: 1000 mg via INTRAVENOUS
  Filled 2018-10-12: qty 200

## 2018-10-12 MED ORDER — MIDAZOLAM HCL 5 MG/5ML IJ SOLN
INTRAMUSCULAR | Status: DC | PRN
  Administered 2018-10-12: 2 mg via INTRAVENOUS

## 2018-10-12 MED ORDER — SODIUM CHLORIDE 0.9 % IV SOLN
INTRAVENOUS | Status: DC | PRN
  Administered 2018-10-12: 500 mL

## 2018-10-12 MED ORDER — PROPOFOL 10 MG/ML IV BOLUS
INTRAVENOUS | Status: DC | PRN
  Administered 2018-10-12: 30 mg via INTRAVENOUS
  Administered 2018-10-12: 160 mg via INTRAVENOUS

## 2018-10-12 MED ORDER — FENTANYL CITRATE (PF) 100 MCG/2ML IJ SOLN
INTRAMUSCULAR | Status: AC
  Filled 2018-10-12: qty 2

## 2018-10-12 MED ORDER — OXYCODONE HCL 5 MG PO TABS
5.0000 mg | ORAL_TABLET | Freq: Once | ORAL | Status: AC | PRN
  Administered 2018-10-12: 5 mg via ORAL

## 2018-10-12 MED ORDER — FENTANYL CITRATE (PF) 100 MCG/2ML IJ SOLN
INTRAMUSCULAR | Status: DC | PRN
  Administered 2018-10-12 (×2): 25 ug via INTRAVENOUS
  Administered 2018-10-12: 100 ug via INTRAVENOUS
  Administered 2018-10-12 (×2): 25 ug via INTRAVENOUS

## 2018-10-12 MED ORDER — OXYCODONE HCL 5 MG PO TABS
ORAL_TABLET | ORAL | Status: AC
  Filled 2018-10-12: qty 1

## 2018-10-12 MED ORDER — LIDOCAINE-EPINEPHRINE 0.5 %-1:200000 IJ SOLN
INTRAMUSCULAR | Status: AC
  Filled 2018-10-12: qty 1

## 2018-10-12 MED ORDER — ONDANSETRON HCL 4 MG/2ML IJ SOLN
INTRAMUSCULAR | Status: DC | PRN
  Administered 2018-10-12: 4 mg via INTRAVENOUS

## 2018-10-12 MED ORDER — SODIUM CHLORIDE 0.9 % IV SOLN
INTRAVENOUS | Status: AC
  Filled 2018-10-12: qty 1.2

## 2018-10-12 MED ORDER — OXYCODONE HCL 5 MG/5ML PO SOLN: 5.0000 mg | Freq: Once | ORAL | Status: AC | PRN

## 2018-10-12 MED ORDER — ONDANSETRON HCL 4 MG/2ML IJ SOLN
INTRAMUSCULAR | Status: AC
  Filled 2018-10-12: qty 4

## 2018-10-12 MED ORDER — FENTANYL CITRATE (PF) 250 MCG/5ML IJ SOLN
INTRAMUSCULAR | Status: AC
  Filled 2018-10-12: qty 5

## 2018-10-12 MED ORDER — LIDOCAINE 2% (20 MG/ML) 5 ML SYRINGE
INTRAMUSCULAR | Status: AC
  Filled 2018-10-12: qty 5

## 2018-10-12 MED ORDER — SODIUM CHLORIDE 0.9 % IV SOLN
INTRAVENOUS | Status: DC | PRN
  Administered 2018-10-12: 100 ug/min via INTRAVENOUS

## 2018-10-12 SURGICAL SUPPLY — 35 items
ARMBAND PINK RESTRICT EXTREMIT (MISCELLANEOUS) ×2 IMPLANT
CANISTER SUCT 3000ML PPV (MISCELLANEOUS) ×2 IMPLANT
CANNULA VESSEL 3MM 2 BLNT TIP (CANNULA) ×2 IMPLANT
CLIP LIGATING EXTRA MED SLVR (CLIP) ×2 IMPLANT
CLIP LIGATING EXTRA SM BLUE (MISCELLANEOUS) ×2 IMPLANT
COVER PROBE W GEL 5X96 (DRAPES) ×2 IMPLANT
COVER WAND RF STERILE (DRAPES) ×2 IMPLANT
DECANTER SPIKE VIAL GLASS SM (MISCELLANEOUS) ×2 IMPLANT
DERMABOND ADVANCED (GAUZE/BANDAGES/DRESSINGS) ×1
DERMABOND ADVANCED .7 DNX12 (GAUZE/BANDAGES/DRESSINGS) ×1 IMPLANT
ELECT REM PT RETURN 9FT ADLT (ELECTROSURGICAL) ×2
ELECTRODE REM PT RTRN 9FT ADLT (ELECTROSURGICAL) ×1 IMPLANT
GLOVE BIO SURGEON STRL SZ 6.5 (GLOVE) ×6 IMPLANT
GLOVE BIO SURGEON STRL SZ7 (GLOVE) ×2 IMPLANT
GLOVE BIOGEL PI IND STRL 7.0 (GLOVE) ×1 IMPLANT
GLOVE BIOGEL PI INDICATOR 7.0 (GLOVE) ×1
GLOVE SS BIOGEL STRL SZ 7.5 (GLOVE) ×1 IMPLANT
GLOVE SUPERSENSE BIOGEL SZ 7.5 (GLOVE) ×1
GLOVE SURG SS PI 6.0 STRL IVOR (GLOVE) ×2 IMPLANT
GLOVE SURG SS PI 6.5 STRL IVOR (GLOVE) ×2 IMPLANT
GOWN STRL REUS W/ TWL LRG LVL3 (GOWN DISPOSABLE) ×4 IMPLANT
GOWN STRL REUS W/TWL LRG LVL3 (GOWN DISPOSABLE) ×4
KIT BASIN OR (CUSTOM PROCEDURE TRAY) ×2 IMPLANT
KIT TURNOVER KIT B (KITS) ×2 IMPLANT
NS IRRIG 1000ML POUR BTL (IV SOLUTION) ×2 IMPLANT
PACK CV ACCESS (CUSTOM PROCEDURE TRAY) ×2 IMPLANT
PAD ARMBOARD 7.5X6 YLW CONV (MISCELLANEOUS) ×4 IMPLANT
SUT MNCRL AB 4-0 PS2 18 (SUTURE) ×4 IMPLANT
SUT PROLENE 6 0 CC (SUTURE) ×2 IMPLANT
SUT SILK 2 0 SH (SUTURE) ×2 IMPLANT
SUT VIC AB 3-0 SH 27 (SUTURE) ×2
SUT VIC AB 3-0 SH 27X BRD (SUTURE) ×2 IMPLANT
TOWEL GREEN STERILE (TOWEL DISPOSABLE) ×2 IMPLANT
UNDERPAD 30X30 (UNDERPADS AND DIAPERS) ×2 IMPLANT
WATER STERILE IRR 1000ML POUR (IV SOLUTION) ×2 IMPLANT

## 2018-10-12 NOTE — Anesthesia Procedure Notes (Signed)
Procedure Name: LMA Insertion Date/Time: 10/12/2018 7:30 AM Performed by: Neldon Newport, CRNA Pre-anesthesia Checklist: Timeout performed, Patient being monitored, Suction available, Emergency Drugs available and Patient identified Patient Re-evaluated:Patient Re-evaluated prior to induction Oxygen Delivery Method: Circle system utilized Preoxygenation: Pre-oxygenation with 100% oxygen Induction Type: IV induction Ventilation: Mask ventilation without difficulty LMA: LMA inserted LMA Size: 4.0 Placement Confirmation: positive ETCO2 and breath sounds checked- equal and bilateral Tube secured with: Tape Dental Injury: Teeth and Oropharynx as per pre-operative assessment

## 2018-10-12 NOTE — Op Note (Signed)
    OPERATIVE REPORT  DATE OF SURGERY: 10/12/2018  PATIENT: Sharon [name], 43 y.o. female MRN: [id number]  DOB: 1975-12-31  PRE-OPERATIVE DIAGNOSIS: End-stage renal disease  POST-OPERATIVE DIAGNOSIS:  Same  PROCEDURE: Left second stage basilic vein transposition fistula  SURGEON:  Curt Jews, M.D.  PHYSICIAN ASSISTANT: Liana Crocker, PA-C  ANESTHESIA: General  EBL: per anesthesia record  Total I/O In: 819 [I.V.:619; IV Piggyback:200] Out: 25 [Blood:25]  BLOOD ADMINISTERED: none  DRAINS: none  SPECIMEN: none  COUNTS CORRECT:  YES  PATIENT DISPOSITION:  PACU - hemodynamically stable  PROCEDURE DETAILS: Patient was taken the operating placed supine position where the area of the left arm left axilla were prepped and draped in usual sterile fashion.  SonoSite ultrasound was used to mark the level of the basilic vein fistula from the antecubital space to the axilla.  Incision was made over the prior brachial artery to basilic vein fistula and the basilic vein was mobilized at the brachial artery anastomosis.  Separate incisions were made on the medial upper arm and the vein was mobilized circumferentially.  Tributary branches were ligated with 3-0 and 4-0 silk ties and divided.  Patient had an excellent sized basilic vein.  The vein was occluded near the brachial artery anastomosis with a regular clamp and the vein was transected.  The vein was brought to the vein harvest tunnel.  A subcutaneous tunnel was created from the antecubital space to the axilla and the vein was brought back through this tunnel.  The vein was marked to reduce risk of twisting.  The vein was redilated with heparinized saline.  The vein was slightly spatulated and sewn into into itself with a running 6-0 Prolene suture.  Clamps removed and excellent thrill was noted.  The wounds were irrigated with saline.  Hemostasis to electrocautery.  The wounds were closed with 3-0 Vicryl in the subcutaneous tissue.  The  skin was closed with Monocryl subcuticular sutures.  Sterile dressing was applied the patient was transferred to the recovery room in stable condition   Sharon [name], M.D., [facility] 10/12/2018 9:23 AM

## 2018-10-12 NOTE — Transfer of Care (Signed)
Immediate Anesthesia Transfer of Care Note  Patient: Sharon [name]  Procedure(s) Performed: BASCILIC VEIN TRANSPOSITION (Left Arm Upper)  Patient Location: PACU  Anesthesia Type:General  Level of Consciousness: awake, alert  and oriented  Airway & Oxygen Therapy: Patient Spontanous Breathing and Patient connected to nasal cannula oxygen  Post-op Assessment: Report given to RN, Post -op Vital signs reviewed and stable and Patient moving all extremities X 4  Post vital signs: Reviewed and stable  Last Vitals:  Vitals Value Taken Time  BP 116/68 10/12/18 0935  Temp    Pulse 74 10/12/18 0936  Resp 25 10/12/18 0936  SpO2 100 % 10/12/18 0936  Vitals shown include unvalidated device data.  Last Pain:  Vitals:   10/12/18 0607  TempSrc: Oral  PainSc: 0-No pain      Patients Stated Pain Goal: 3 (45/99/77 4142)  Complications: No apparent anesthesia complications

## 2018-10-12 NOTE — Anesthesia Postprocedure Evaluation (Signed)
Anesthesia Post Note  Patient: Nelida Dulude  Procedure(s) Performed: [place] (Left Arm Upper)     Patient location during evaluation: PACU Anesthesia Type: General Level of consciousness: awake and alert Pain management: pain level controlled Vital Signs Assessment: post-procedure vital signs reviewed and stable Respiratory status: spontaneous breathing, nonlabored ventilation, respiratory function stable and patient connected to nasal cannula oxygen Cardiovascular status: blood pressure returned to baseline and stable Postop Assessment: no apparent nausea or vomiting Anesthetic complications: no    Last Vitals:  Vitals:   10/12/18 1005 10/12/18 1015  BP: 109/67   Pulse: 75 76  Resp: 20 18  Temp:  (!) 36.3 C  SpO2: 98% 97%    Last Pain:  Vitals:   10/12/18 1005  TempSrc:   PainSc: 2                  Paola Flynt COKER

## 2018-10-12 NOTE — Interval H&P Note (Signed)
History and Physical Interval Note:  10/12/2018 7:10 AM  Sharon [name]  has presented today for surgery, with the diagnosis of END STAGE RENAL DISEASE ON DIALYSIS.  The various methods of treatment have been discussed with the patient and family. After consideration of risks, benefits and other options for treatment, the patient has consented to  Procedure(s): [place] (Left) as a surgical intervention.  The patient's history has been reviewed, patient examined, no change in status, stable for surgery.  I have reviewed the patient's chart and labs.  Questions were answered to the patient's satisfaction.     Curt Jews

## 2018-10-12 NOTE — Discharge Instructions (Signed)
° °  [facility]  Discharge Instructions  AV Fistula or Graft Surgery for Dialysis Access  Please refer to the following instructions for your post-procedure care. Your surgeon or physician assistant will discuss any changes with you.  Activity  You may drive the day following your surgery, if you are comfortable and no longer taking prescription pain medication. Resume full activity as the soreness in your incision resolves.  Bathing/Showering  You may shower after you go home. Keep your incision dry for 48 hours. Do not soak in a bathtub, hot tub, or swim until the incision heals completely. You may not shower if you have a hemodialysis catheter.  Incision Care  Clean your incision with mild soap and water after 48 hours. Pat the area dry with a clean towel. You do not need a bandage unless otherwise instructed. Do not apply any ointments or creams to your incision. You may have skin glue on your incision. Do not peel it off. It will come off on its own in about one week. Your arm may swell a bit after surgery. To reduce swelling use pillows to elevate your arm so it is above your heart. Your doctor will tell you if you need to lightly wrap your arm with an ACE bandage.  Diet  Resume your normal diet. There are not special food restrictions following this procedure. In order to heal from your surgery, it is CRITICAL to get adequate nutrition. Your body requires vitamins, minerals, and protein. Vegetables are the best source of vitamins and minerals. Vegetables also provide the perfect balance of protein. Processed food has little nutritional value, so try to avoid this.  Medications  Resume taking all of your medications. If your incision is causing pain, you may take over-the counter pain relievers such as acetaminophen (Tylenol). If you were prescribed a stronger pain medication, please be aware these medications can cause nausea and constipation. Prevent  nausea by taking the medication with a snack or meal. Avoid constipation by drinking plenty of fluids and eating foods with high amount of fiber, such as fruits, vegetables, and grains.  Do not take Tylenol if you are taking prescription pain medications.  Follow up Your surgeon may want to see you in the office following your access surgery. If so, this will be arranged at the time of your surgery.  Please call us immediately for any of the following conditions:  Increased pain, redness, drainage (pus) from your incision site Fever of 101 degrees or higher Severe or worsening pain at your incision site Hand pain or numbness.  Reduce your risk of vascular disease:  Stop smoking. If you would like help, call [facility] at 1-800-QUIT-NOW 989-243-6680) or [place] at [place] your cholesterol Maintain a desired weight Control your diabetes Keep your blood pressure down  Dialysis  It will take several weeks to several months for your new dialysis access to be ready for use. Your surgeon will determine when it is okay to use it. Your nephrologist will continue to direct your dialysis. You can continue to use your Permcath until your new access is ready for use.   10/12/2018 Sharon [name] [id number] Oct 26, 1975  Surgeon(s): Early, Arvilla Meres, MD  Procedure(s): 2nd stage basilic vein transposition  x Do not stick fistula for 6 weeks    If you have any questions, please call the office at [phone].

## 2018-10-12 NOTE — Anesthesia Preprocedure Evaluation (Addendum)
Anesthesia Evaluation  Patient identified by MRN, date of birth, ID band Patient awake    Reviewed: Allergy & Precautions, NPO status , Patient's Chart, lab work & pertinent test results  History of Anesthesia Complications (+) PONV and history of anesthetic complications  Airway Mallampati: II  TM Distance: >3 FB Neck ROM: full    Dental  (+) Dental Advidsory Given   Pulmonary    breath sounds clear to auscultation       Cardiovascular hypertension,  Rhythm:Regular Rate:Normal     Neuro/Psych  Headaches,    GI/Hepatic   Endo/Other    Renal/GU ESRF and DialysisRenal disease     Musculoskeletal   Abdominal   Peds  Hematology  (+) Blood dyscrasia, anemia ,   Anesthesia Other Findings   Reproductive/Obstetrics                            Anesthesia Physical Anesthesia Plan  ASA: II  Anesthesia Plan:    Post-op Pain Management:    Induction:   PONV Risk Score and Plan: Ondansetron and Dexamethasone  Airway Management Planned: LMA  Additional Equipment:   Intra-op Plan:   Post-operative Plan:   Informed Consent: I have reviewed the patients History and Physical, chart, labs and discussed the procedure including the risks, benefits and alternatives for the proposed anesthesia with the patient or authorized representative who has indicated his/her understanding and acceptance.     Dental Advisory Given  Plan Discussed with:   Anesthesia Plan Comments:       Anesthesia Quick Evaluation

## 2018-10-13 ENCOUNTER — Encounter (HOSPITAL_COMMUNITY): Payer: Self-pay | Admitting: Vascular Surgery

## 2018-10-20 NOTE — Progress Notes (Signed)
POST OPERATIVE OFFICE NOTE    CC:  F/u for surgery  HPI:  This is a 43 y.o. 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 Donnetta Hutching.    Her first stage was on 08/21/2018 as well as TDC placement also by Dr. Donnetta Hutching.    She presents today for follow up.  She states that she started itching on her upper arm a couple of days after surgery and has since developed a rash/small blisters on the upper arm.  She used hydrocortisone cream once yesterday, but no relief.  She has also been using Benadryl for relief.    She dialyzes M/W/F at [employer] in [facility].   Allergies  Allergen Reactions  . Penicillins Anaphylaxis    Did it involve swelling of the face/tongue/throat, SOB, or low BP? Yes Did it involve sudden or severe rash/hives, skin peeling, or any reaction on the inside of your mouth or nose? No Did you need to seek medical attention at a hospital or doctor's office? Yes When did it last happen?Childhood allergy If all above answers are "NO", may proceed with cephalosporin use.   . Other     NO BLOOD PRODUCTS > NO EXPLANATION [RELIGIOUS PREFERENCE?]    Current Outpatient Medications  Medication Sig Dispense Refill  . acetaminophen (TYLENOL) 500 MG tablet Take 500-1,000 mg by mouth every 6 (six) hours as needed for moderate pain or headache.    Marland Kitchen amLODipine (NORVASC) 5 MG tablet Take 5 mg by mouth at bedtime.     Marland Kitchen b complex-vitamin c-folic acid (NEPHRO-VITE) 0.8 MG TABS tablet Take 1 tablet by mouth daily after supper.    . Biotin 5 MG TBDP Take 5 mg by mouth daily after lunch.    . Cholecalciferol (VITAMIN D3) 50 MCG (2000 UT) TABS Take 50 mcg by mouth every Tuesday, Thursday, and Saturday at 6 PM. In the evening.    . loratadine (CLARITIN) 10 MG tablet Take 10 mg by mouth daily as needed for allergies.    Marland Kitchen ondansetron (ZOFRAN) 4 MG tablet Take 4 mg by mouth daily.    Marland Kitchen oxyCODONE (ROXICODONE) 5 MG immediate release tablet Take 1 tablet (5 mg total) by mouth every 6  (six) hours as needed. 20 tablet 0  . telmisartan (MICARDIS) 80 MG tablet Take 80 mg by mouth at bedtime.      No current facility-administered medications for this visit.      ROS:  See HPI  Physical Exam:  Today's Vitals   10/24/18 1401  BP: 113/78  Pulse: 78  Resp: 12  Temp: 97.6 F (36.4 C)  TempSrc: Temporal  SpO2: 99%  Weight: 156 lb 6.4 oz (70.9 kg)  Height: 5\' 8"  (1.727 m)  PainSc: 3    Body mass index is 23.78 kg/m.   Incision:  Incisions have healed nicely Extremities:  Excellent thrill within fistula and easily palpable;  She has a rash/small blisters present around her incisions on the upper arm.       Assessment/Plan:  This is a 43 y.o. 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 Donnetta Hutching.    Her first stage was on 08/21/2018 as well as TDC placement also by Dr. Donnetta Hutching.     -continue using hydrocortisone cream and benadryl.   -pt seen and examined with Dr. Donnetta Hutching and unsure what has caused this rash.  This does not appear to be infectious.  Dr. Donnetta Hutching feels it will run its course.   -  she will f/u with [place] as needed or will call should her rash not resolve.  May need referral to dermatology if it does not resolve.   -may use fistula in one week as long as rash has resolved.   -can remove catheter once the fistula is working to the satisfaction of nephrologist.    Leontine Locket, PA-C [facility] [phone]  Clinic MD:  Early

## 2018-10-24 ENCOUNTER — Other Ambulatory Visit: Payer: Self-pay

## 2018-10-24 ENCOUNTER — Ambulatory Visit (INDEPENDENT_AMBULATORY_CARE_PROVIDER_SITE_OTHER): Payer: Self-pay | Admitting: Physician Assistant

## 2018-10-24 VITALS — BP 113/78 | HR 78 | Temp 97.6°F | Resp 12 | Ht 68.0 in | Wt 156.4 lb

## 2018-10-24 DIAGNOSIS — N186 End stage renal disease: Secondary | ICD-10-CM

## 2018-10-24 DIAGNOSIS — Z992 Dependence on renal dialysis: Secondary | ICD-10-CM

## 2019-03-06 ENCOUNTER — Encounter: Payer: Self-pay | Admitting: Physical Therapy

## 2019-03-06 ENCOUNTER — Ambulatory Visit: Payer: PRIVATE HEALTH INSURANCE | Attending: Nephrology | Admitting: Physical Therapy

## 2019-03-06 ENCOUNTER — Other Ambulatory Visit: Payer: Self-pay

## 2019-03-06 DIAGNOSIS — M6281 Muscle weakness (generalized): Secondary | ICD-10-CM | POA: Diagnosis present

## 2019-03-06 DIAGNOSIS — M25612 Stiffness of left shoulder, not elsewhere classified: Secondary | ICD-10-CM | POA: Diagnosis present

## 2019-03-06 DIAGNOSIS — R293 Abnormal posture: Secondary | ICD-10-CM | POA: Insufficient documentation

## 2019-03-06 DIAGNOSIS — M25512 Pain in left shoulder: Secondary | ICD-10-CM | POA: Insufficient documentation

## 2019-03-06 DIAGNOSIS — R29810 Facial weakness: Secondary | ICD-10-CM | POA: Diagnosis not present

## 2019-03-06 DIAGNOSIS — G8929 Other chronic pain: Secondary | ICD-10-CM | POA: Insufficient documentation

## 2019-03-06 NOTE — Therapy (Signed)
[facility] [address]  [place], [place], [address] Phone: [phone]   Fax:  [phone]  Physical Therapy Evaluation  Patient Details  Name: Sharon [name] MRN: [id number] Date of Birth: 11-30-1975 Referring Provider (PT): Otelia Santee, MD   Encounter Date: 03/06/2019  PT End of Session - 03/06/19 1526    Visit Number  1    Number of Visits  9    Date for PT Re-Evaluation  05/01/19    Authorization Type  [employer]    PT Start Time  1446    PT Stop Time  1521    PT Time Calculation (min)  35 min    Activity Tolerance  Patient tolerated treatment well;Patient limited by pain    Behavior During Therapy  [facility] for tasks assessed/performed       Past Medical History:  Diagnosis Date  . Anemia   . Contact lens/glasses fitting   . Headache    MIGRAINES AND TENSION  . Heart murmur    was told  years ago that she had a murmur, but no problems  . Hyperplasia, parathyroid ([place])   . Hypertension   . Hyponatremia   . Polycystic kidney disease, congenital    [name]. MD. w/visits 2-3 times yrly - Stage 5 CKD  . PONV (postoperative nausea and vomiting)    after hysteroscopy in Feb. 2020  . Seizures ([place])    " as a child only"    Past Surgical History:  Procedure Laterality Date  . AV FISTULA PLACEMENT Left 02/24/2018   Procedure: ARTERIOVENOUS (AV) FISTULA CREATION LEFT ARM;  Surgeon: Rosetta Posner, MD;  Location: [facility] OR;  Service: Vascular;  Laterality: Left;  . AV FISTULA PLACEMENT Left 08/21/2018   Procedure: CREATION OF BRACHIOBASILIC ARTERIOVENOUS FISTULA  LEFT ARM;  Surgeon: Rosetta Posner, MD;  Location: [facility];  Service: Vascular;  Laterality: Left;  . BASCILIC VEIN TRANSPOSITION Left 10/12/2018   Procedure: BASCILIC VEIN TRANSPOSITION;  Surgeon: Rosetta Posner, MD;  Location: [facility];  Service: Vascular;  Laterality: Left;  . BUNIONECTOMY     AS CHILD  . COLONSCOPY  2014  . DILATATION &  CURETTAGE/HYSTEROSCOPY WITH MYOSURE N/A 06/02/2018   Procedure: [place];  Surgeon: Servando Salina, MD;  Location: [facility];  Service: Gynecology;  Laterality: N/A;  . HERNIA REPAIR  8115   UMBILICAL  . HYSTEROSCOPY W/D&C N/A 10/23/2012   Procedure: DILATATION AND CURETTAGE / Diagnostic HYSTEROSCOPY;  Surgeon: Marvene Staff, MD;  Location: [facility];  Service: Gynecology;  Laterality: N/A;  . INSERTION OF DIALYSIS CATHETER N/A 08/21/2018   Procedure: INSERTION OFTUNNELED  DIALYSIS CATHETER;  Surgeon: Rosetta Posner, MD;  Location: [facility] OR;  Service: Vascular;  Laterality: N/A;  . IR RADIOLOGIST EVAL & MGMT  06/27/2018  . MOUTH SURGERY  AS TEENAGER  . ROBOTIC ASSISTED LAPAROSCOPIC LYSIS OF ADHESION N/A 10/23/2012   Procedure: ROBOTIC ASSISTED  excision OF stage 2 pelvic ENDOMETRIOSIS;  Surgeon: Marvene Staff, MD;  Location: [facility];  Service: Gynecology;  Laterality: N/A;    There were no vitals filed for this visit.   Subjective Assessment - 03/06/19 1447    Subjective  Patient reporting L shoulder pain since about June 2020 when she had her 2nd surgery for a L AV fistula. Notes that she has not been able to move it much. Unable to reach behind the back, trouble sleeping without propping it up,  reaching overhead. Pain is located diffusely over the joint. Reports intermittent NT in L hand and over dorsal forearm. Currently undergoing peritoneal hemodialysis daily.    Pertinent History  seizures, HTN, HA, anemia, bunionectomy, L AV fistula- ESRD on hemodialysis    Limitations  Lifting;House hold activities    Diagnostic tests  none recent    Patient Stated Goals  get more ROM in this arm    Currently in Pain?  Yes    Pain Score  0-No pain    Pain Location  Shoulder    Pain Orientation  Left    Pain Descriptors / Indicators  Aching    Pain Type  Acute pain         [facility] PT Assessment - 03/06/19 1454      Assessment    Medical Diagnosis  L shoulder pain    Referring Provider (PT)  Otelia Santee, MD    Onset Date/Surgical Date  10/04/18    Hand Dominance  Right    Next MD Visit  not scheduled    Prior Therapy  no      Precautions   Precautions  --   L AV fistula & peritoneal dialysis catheter     Balance Screen   Has the patient fallen in the past 6 months  No    Has the patient had a decrease in activity level because of a fear of falling?   No    Is the patient reluctant to leave their home because of a fear of falling?   No      Home Environment   Living Environment  Private residence    Living Arrangements  Children    Type of Home  --   townhome     Prior Function   Level of Independence  Independent    Vocation  Full time employment    Vocation Requirements  working from home    Leisure  none      Cognition   Overall Cognitive Status  Within Functional Limits for tasks assessed      Observation/Other Assessments   Observations  well-healed AV fistula site over L arm      Sensation   Light Touch  Appears Intact      Coordination   Gross Motor Movements are Fluid and Coordinated  Yes      Posture/Postural Control   Posture/Postural Control  Postural limitations    Postural Limitations  Rounded Shoulders;Forward head;Posterior pelvic tilt      ROM / Strength   AROM / PROM / Strength  AROM;Strength      AROM   AROM Assessment Site  Shoulder    Right/Left Shoulder  Right;Left    Right Shoulder Flexion  170 Degrees    Right Shoulder ABduction  175 Degrees    Right Shoulder Internal Rotation  --   FIR T6   Right Shoulder External Rotation  --   FER T3   Left Shoulder Flexion  100 Degrees   pain   Left Shoulder ABduction  80 Degrees   pain   Left Shoulder Internal Rotation  --   FIR T12; pain   Left Shoulder External Rotation  --   FER back of head; pain     Strength   Strength Assessment Site  Shoulder    Right/Left Shoulder  Right;Left    Right Shoulder Flexion  4+/5     Right Shoulder ABduction  4+/5    Right Shoulder Internal Rotation  4/5    Right Shoulder External Rotation  4/5    Left Shoulder Flexion  4/5   pain   Left Shoulder ABduction  4-/5   pain   Left Shoulder Internal Rotation  4-/5   pain   Left Shoulder External Rotation  4-/5   pain     Palpation   Palpation comment  TTP over L UT, infraspinatus, posterior, lateral, anterior deltoid, and proximal biceps tendon                Objective measurements completed on examination: See above findings.              PT Education - 03/06/19 1525    Education Details  prognosis, POC, HEP    Person(s) Educated  Patient    Methods  Explanation;Demonstration;Tactile cues;Verbal cues;Handout    Comprehension  Verbalized understanding;Returned demonstration       PT Short Term Goals - 03/06/19 1742      PT SHORT TERM GOAL #1   Title  Patient to be indpendent with initial HEP.    Time  4    Period  Weeks    Status  New    Target Date  04/03/19        PT Long Term Goals - 03/06/19 1742      PT LONG TERM GOAL #1   Title  Patient to be independent with advanced HEP.    Time  8    Period  Weeks    Status  New    Target Date  05/01/19      PT LONG TERM GOAL #2   Title  Patient to demonstrate L shoulder AROM WFL and without pain limiting.    Time  8    Period  Weeks    Status  New    Target Date  05/01/19      PT LONG TERM GOAL #3   Title  Patient to demonstrate L shoulder strength >/=4+/5.    Time  8    Period  Weeks    Status  New    Target Date  05/01/19      PT LONG TERM GOAL #4   Title  Patient to report ability to reach her back with L UE in order to improve ease of dressing and grooming.    Time  8    Period  Weeks    Status  New    Target Date  05/01/19      PT LONG TERM GOAL #5   Title  Patient to report 75% improvement in sleeping tolerance d/t L shoulder pain.    Time  8    Period  Weeks    Status  New    Target Date  05/01/19              Plan - 03/06/19 1526    Clinical Impression Statement  Patient is a 43y/o F presenting to OPPT with c/o L shoulder pain since June of this year after undergoing her 2nd surgery for L AV fistula. Now reporting pain diffusely over L shoulder joint as well as noting stiffness. Unable to reach behind the back, has trouble sleeping without propping the arm up, and pain with reaching overhead. Currently undergoes peritoneal dialysis daily. Patient today presenting with limited and painful L shoulder AROM, decreased L shoulder strength, abnormal posture, and tenderness over L UT, infraspinatus, deltoid, and proximal biceps tendon. Patient signs and symptoms as well as subjective report seem consistent  with Adhesive Capsulitis. Patient educated on gentle AAROM HEP and reported understanding. Would benefit from skilled PT services 1x/week for 8 weeks to address aforementioned impairments.    Personal Factors and Comorbidities  Sex;Comorbidity 3+;Profession;Fitness;Past/Current Experience;Time since onset of injury/illness/exacerbation    Comorbidities  seizures, HTN, HA, anemia, bunionectomy, L AV fistula- ESRD on hemodialysis    Examination-Activity Limitations  Sleep;Bed Mobility;Caring for Others;Carry;Dressing;Hygiene/Grooming;Lift;Reach Overhead    Examination-Participation Restrictions  Cleaning;Shop;Community Activity;Driving;Yard Work;Interpersonal Relationship;Laundry;Meal Prep    Stability/Clinical Decision Making  Stable/Uncomplicated    Clinical Decision Making  Low    Rehab Potential  Good    PT Frequency  1x / week    PT Duration  8 weeks    PT Treatment/Interventions  ADLs/Self Care Home Management;Cryotherapy;Electrical Stimulation;Iontophoresis 4mg /ml Dexamethasone;Moist Heat;Therapeutic exercise;Therapeutic activities;Functional mobility training;Ultrasound;Neuromuscular re-education;Cognitive remediation;Patient/family education;Manual techniques;Vasopneumatic  Device;Taping;Energy conservation;Dry needling;Passive range of motion;Scar mobilization    PT Next Visit Plan  reassess HEP; FOTO    Consulted and Agree with Plan of Care  Patient       Patient will benefit from skilled therapeutic intervention in order to improve the following deficits and impairments:  Hypomobility, Decreased activity tolerance, Decreased strength, Increased fascial restricitons, Impaired UE functional use, Pain, Improper body mechanics, Decreased range of motion, Impaired flexibility, Postural dysfunction  Visit Diagnosis: Chronic left shoulder pain  Stiffness of left shoulder, not elsewhere classified  Muscle weakness (generalized)  Abnormal posture     Problem List Patient Active Problem List   Diagnosis Date Noted  . Chronic kidney disease (CKD), stage IV (severe) ([place]) 04/05/2018  . History of anemia due to CKD 07/26/2016  . SINUSITIS- ACUTE-NOS 05/22/2008     Janene Harvey, PT, DPT 03/06/19 5:54 PM   [facility] [address]  [place], [place], [address] Phone: [phone]   Fax:  [phone]  Name: Tijana Walder MRN: [id number] Date of Birth: [birth year]

## 2019-03-07 ENCOUNTER — Ambulatory Visit (INDEPENDENT_AMBULATORY_CARE_PROVIDER_SITE_OTHER): Payer: Medicare Other | Admitting: Family Medicine

## 2019-03-07 ENCOUNTER — Encounter: Payer: Self-pay | Admitting: Family Medicine

## 2019-03-07 VITALS — BP 110/74 | HR 86 | Temp 96.9°F | Ht 68.0 in | Wt 159.4 lb

## 2019-03-07 DIAGNOSIS — R112 Nausea with vomiting, unspecified: Secondary | ICD-10-CM

## 2019-03-07 DIAGNOSIS — Q613 Polycystic kidney, unspecified: Secondary | ICD-10-CM | POA: Insufficient documentation

## 2019-03-07 DIAGNOSIS — N184 Chronic kidney disease, stage 4 (severe): Secondary | ICD-10-CM

## 2019-03-07 DIAGNOSIS — M7502 Adhesive capsulitis of left shoulder: Secondary | ICD-10-CM

## 2019-03-07 MED ORDER — PANTOPRAZOLE SODIUM 40 MG PO TBEC: 40.0000 mg | DELAYED_RELEASE_TABLET | Freq: Every day | ORAL | 2 refills | Status: DC

## 2019-03-07 MED ORDER — ONDANSETRON HCL 4 MG PO TABS: 4.0000 mg | ORAL_TABLET | Freq: Every day | ORAL | 1 refills | Status: DC

## 2019-03-07 NOTE — Progress Notes (Signed)
Chief Complaint  Patient presents with  . New Patient (Initial Visit)  . Nausea    one year  . Shoulder Pain    left--sees PT       New Patient Visit SUBJECTIVE: HPI: Sharon [name] is an 43 y.o.female who is being seen for establishing care.  The patient has not had PCP is quite some time.   1 yr of nausea and gagging. No wt changes outside of fluids. Taking Protonix mg 3-4 times per week. Started around 2 weeks ago. Has been taking Zofran as well. BM's normal, maybe more on constipated side due to peritoneal dialysis. No blod in vomit, which is intermittent.  Cold water can help.   Dx'd w Frozen shoulder, issue started over the summer after a fistula procedure. Pain after procedure never improved and she lost ROM. Saw PT yesterday. Pain is mainly w certain movements/positions.   Allergies  Allergen Reactions  . Penicillins Anaphylaxis    Did it involve swelling of the face/tongue/throat, SOB, or low BP? Yes Did it involve sudden or severe rash/hives, skin peeling, or any reaction on the inside of your mouth or nose? No Did you need to seek medical attention at a hospital or doctor's office? Yes When did it last happen?Childhood allergy If all above answers are "NO", may proceed with cephalosporin use.   . Other     NO BLOOD PRODUCTS > NO EXPLANATION [RELIGIOUS PREFERENCE?]    Past Medical History:  Diagnosis Date  . Anemia   . Contact lens/glasses fitting   . Headache    MIGRAINES AND TENSION  . Heart murmur    was told  years ago that she had a murmur, but no problems  . Hyperplasia, parathyroid ([place])   . Hypertension   . Hyponatremia   . Polycystic kidney disease, congenital    [name]. MD. w/visits 2-3 times yrly - Stage 5 CKD  . PONV (postoperative nausea and vomiting)    after hysteroscopy in Feb. 2020  . Seizures ([place])    " as a child only"   Past Surgical History:  Procedure Laterality Date  . AV FISTULA PLACEMENT Left 02/24/2018   Procedure: ARTERIOVENOUS (AV) FISTULA CREATION LEFT ARM;  Surgeon: Rosetta Posner, MD;  Location: [facility];  Service: Vascular;  Laterality: Left;  . AV FISTULA PLACEMENT Left 08/21/2018   Procedure: CREATION OF BRACHIOBASILIC ARTERIOVENOUS FISTULA  LEFT ARM;  Surgeon: Rosetta Posner, MD;  Location: [place];  Service: Vascular;  Laterality: Left;  . BASCILIC VEIN TRANSPOSITION Left 10/12/2018   Procedure: BASCILIC VEIN TRANSPOSITION;  Surgeon: Rosetta Posner, MD;  Location: [place];  Service: Vascular;  Laterality: Left;  . BUNIONECTOMY     AS CHILD  . COLONSCOPY  2014  . DILATATION & CURETTAGE/HYSTEROSCOPY WITH MYOSURE N/A 06/02/2018   Procedure: [place];  Surgeon: Servando Salina, MD;  Location: [place];  Service: Gynecology;  Laterality: N/A;  . HERNIA REPAIR  7824   UMBILICAL  . HYSTEROSCOPY W/D&C N/A 10/23/2012   Procedure: DILATATION AND CURETTAGE / Diagnostic HYSTEROSCOPY;  Surgeon: Marvene Staff, MD;  Location: [facility];  Service: Gynecology;  Laterality: N/A;  . INSERTION OF DIALYSIS CATHETER N/A 08/21/2018   Procedure: INSERTION OFTUNNELED  DIALYSIS CATHETER;  Surgeon: Rosetta Posner, MD;  [facility];  Service: Vascular;  Laterality: N/A;  . IR RADIOLOGIST EVAL & MGMT  06/27/2018  . MOUTH SURGERY  AS TEENAGER  . ROBOTIC ASSISTED LAPAROSCOPIC LYSIS  OF ADHESION N/A 10/23/2012   Procedure: ROBOTIC ASSISTED  excision OF stage 2 pelvic ENDOMETRIOSIS;  Surgeon: Marvene Staff, MD;  Location: [facility];  Service: Gynecology;  Laterality: N/A;   Family History  Problem Relation Age of Onset  . Hypertension Mother   . Polycystic kidney disease Mother   . Hypertension Father    Allergies  Allergen Reactions  . Penicillins Anaphylaxis    Did it involve swelling of the face/tongue/throat, SOB, or low BP? Yes Did it involve sudden or severe rash/hives, skin peeling, or any reaction on the inside of your mouth or nose? No Did you  need to seek medical attention at a hospital or doctor's office? Yes When did it last happen?Childhood allergy If all above answers are "NO", may proceed with cephalosporin use.   . Other     NO BLOOD PRODUCTS > NO EXPLANATION [RELIGIOUS PREFERENCE?]    Current Outpatient Medications:  .  acetaminophen (TYLENOL) 500 MG tablet, Take 500-1,000 mg by mouth every 6 (six) hours as needed for moderate pain or headache., Disp: , Rfl:  .  b complex-vitamin c-folic acid (NEPHRO-VITE) 0.8 MG TABS tablet, Take 1 tablet by mouth daily after supper., Disp: , Rfl:  .  Biotin 5 MG TBDP, Take 5 mg by mouth daily after lunch., Disp: , Rfl:  .  calcitRIOL (ROCALTROL) 0.25 MCG capsule, Take 0.25 mcg by mouth daily., Disp: , Rfl:  .  diphenhydrAMINE (BENADRYL) 25 mg capsule, Take 25 mg by mouth as needed., Disp: , Rfl:  .  ondansetron (ZOFRAN) 4 MG tablet, Take 1 tablet (4 mg total) by mouth daily., Disp: 30 tablet, Rfl: 1 .  telmisartan (MICARDIS) 80 MG tablet, Take 80 mg by mouth at bedtime. , Disp: , Rfl:  .  pantoprazole (PROTONIX) 40 MG tablet, Take 1 tablet (40 mg total) by mouth daily., Disp: 30 tablet, Rfl: 2  No LMP recorded.  ROS Const: Denies fevers  GI +N/V   OBJECTIVE: BP 110/74 (BP Location: Right Arm, Patient Position: Sitting, Cuff Size: Normal)   Pulse 86   Temp (!) 96.9 F (36.1 C) (Temporal)   Ht 5\' 8"  (1.727 m)   Wt 159 lb 6 oz (72.3 kg)   SpO2 99%   BMI 24.23 kg/m   Constitutional: -  VS reviewed -  Well developed, well nourished, appears stated age -  No apparent distress  Psychiatric: -  Oriented to person, place, and time -  Memory intact -  Affect and mood normal -  Fluent conversation, good eye contact -  Judgment and insight age appropriate  Eye: -  Conjunctivae clear, no discharge -  Pupils symmetric, round, reactive to light  ENMT: -  MMM    Pharynx moist, no exudate, no erythema  Neck: -  No gross swelling, no palpable masses -  Thyroid midline, not  enlarged, mobile, no palpable masses  Cardiovascular: -  RRR -  No LE edema  Respiratory: -  Normal respiratory effort, no accessory muscle use, no retraction -  Breath sounds equal, no wheezes, no ronchi, no crackles  Gastrointestinal: -  Bowel sounds normal -  No tenderness, no distention, no guarding, no masses  Musculoskeletal: -  Decreased ROM of L shoulder -  Gait normal  Skin: -  No significant lesion on inspection -  Warm and dry to palpation   ASSESSMENT/PLAN: Nausea and vomiting, intractability of vomiting not specified, unspecified vomiting type - Plan: ondansetron (ZOFRAN) 4 MG tablet, pantoprazole (PROTONIX) 40 MG  tablet, Ambulatory referral to Gastroenterology  Adhesive capsulitis of left shoulder  PPI bid for 1 weeks, then qd. Cont Zofran prn. GI referral.  Cont w PT. Offered injection, declined at this time.  Patient should return in 6 mo for CPE (says [employer] is primary, rec'd she contact them to be sure) or prn. The patient voiced understanding and agreement to the plan.   [place], DO 03/07/19  11:13 AM

## 2019-03-07 NOTE — Patient Instructions (Addendum)
If you ever want a shot in the shoulder for pain and increased range of motion, let me know.  If you do not hear anything about your referral in the next 1-2 weeks, call our office and ask for an update.  Take the Protonix twice daily for the next week and then take daily.   Let us know if you need anything.

## 2019-03-08 ENCOUNTER — Encounter: Payer: Self-pay | Admitting: Gastroenterology

## 2019-03-13 ENCOUNTER — Other Ambulatory Visit: Payer: Self-pay

## 2019-03-13 ENCOUNTER — Ambulatory Visit: Payer: PRIVATE HEALTH INSURANCE | Admitting: Physical Therapy

## 2019-03-13 DIAGNOSIS — M6281 Muscle weakness (generalized): Secondary | ICD-10-CM

## 2019-03-13 DIAGNOSIS — G8929 Other chronic pain: Secondary | ICD-10-CM

## 2019-03-13 DIAGNOSIS — M25612 Stiffness of left shoulder, not elsewhere classified: Secondary | ICD-10-CM

## 2019-03-13 DIAGNOSIS — M25512 Pain in left shoulder: Secondary | ICD-10-CM

## 2019-03-13 DIAGNOSIS — R293 Abnormal posture: Secondary | ICD-10-CM

## 2019-03-13 NOTE — Therapy (Signed)
[facility] [address]  [place], [place], [address] Phone: [phone]   Fax:  [phone]  Physical Therapy Treatment  Patient Details  Name: Sharon [name] MRN: [id number] Date of Birth: 12/24/1975 Referring Provider (PT): Otelia Santee, MD   Encounter Date: 03/13/2019  PT End of Session - 03/13/19 1638    Visit Number  2    Number of Visits  9    Date for PT Re-Evaluation  05/01/19    Authorization Type  [employer]    PT Start Time  5573    PT Stop Time  1700    PT Time Calculation (min)  43 min    Activity Tolerance  Patient tolerated treatment well;Patient limited by pain    Behavior During Therapy  [facility] for tasks assessed/performed       Past Medical History:  Diagnosis Date  . Anemia   . Contact lens/glasses fitting   . Headache    MIGRAINES AND TENSION  . Heart murmur    was told  years ago that she had a murmur, but no problems  . Hyperplasia, parathyroid ([place])   . Hypertension   . Hyponatremia   . Polycystic kidney disease, congenital    [name]. MD. w/visits 2-3 times yrly - Stage 5 CKD  . PONV (postoperative nausea and vomiting)    after hysteroscopy in Feb. 2020  . Seizures ([place])    " as a child only"    Past Surgical History:  Procedure Laterality Date  . AV FISTULA PLACEMENT Left 02/24/2018   Procedure: ARTERIOVENOUS (AV) FISTULA CREATION LEFT ARM;  Surgeon: Rosetta Posner, MD;  Location: [facility] OR;  Service: Vascular;  Laterality: Left;  . AV FISTULA PLACEMENT Left 08/21/2018   Procedure: CREATION OF BRACHIOBASILIC ARTERIOVENOUS FISTULA  LEFT ARM;  Surgeon: Rosetta Posner, MD;  Location: [place];  Service: Vascular;  Laterality: Left;  . BASCILIC VEIN TRANSPOSITION Left 10/12/2018   Procedure: BASCILIC VEIN TRANSPOSITION;  Surgeon: Rosetta Posner, MD;  Location: [place];  Service: Vascular;  Laterality: Left;  . BUNIONECTOMY     AS CHILD  . COLONSCOPY  2014  . DILATATION &  CURETTAGE/HYSTEROSCOPY WITH MYOSURE N/A 06/02/2018   Procedure: [place];  Surgeon: Servando Salina, MD;  Location: [place];  Service: Gynecology;  Laterality: N/A;  . HERNIA REPAIR  2202   UMBILICAL  . HYSTEROSCOPY W/D&C N/A 10/23/2012   Procedure: DILATATION AND CURETTAGE / Diagnostic HYSTEROSCOPY;  Surgeon: Marvene Staff, MD;  Location: [facility];  Service: Gynecology;  Laterality: N/A;  . INSERTION OF DIALYSIS CATHETER N/A 08/21/2018   Procedure: INSERTION OFTUNNELED  DIALYSIS CATHETER;  Surgeon: Rosetta Posner, MD;  [facility];  Service: Vascular;  Laterality: N/A;  . IR RADIOLOGIST EVAL & MGMT  06/27/2018  . MOUTH SURGERY  AS TEENAGER  . ROBOTIC ASSISTED LAPAROSCOPIC LYSIS OF ADHESION N/A 10/23/2012   Procedure: ROBOTIC ASSISTED  excision OF stage 2 pelvic ENDOMETRIOSIS;  Surgeon: Marvene Staff, MD;  Location: [facility];  Service: Gynecology;  Laterality: N/A;    There were no vitals filed for this visit.  Subjective Assessment - 03/13/19 1635    Subjective  Pt arriving to therapy today reporting 8/10 pain in L shoulder. Pt reporting lifting restrictions of 10 pounds in her L UE due to AV fistula.    Pertinent History  seizures, HTN, HA, anemia, bunionectomy, L AV fistula- ESRD on hemodialysis  Limitations  Lifting;House hold activities    Diagnostic tests  none recent    Patient Stated Goals  get more ROM in this arm    Currently in Pain?  Yes    Pain Score  8     Pain Location  Shoulder    Pain Orientation  Left    Pain Descriptors / Indicators  Aching    Pain Type  Acute pain    Pain Onset  More than a month ago    Pain Frequency  Constant         [facility] PT Assessment - 03/13/19 0001      Assessment   Medical Diagnosis  L shoulder pain    Referring Provider (PT)  Otelia Santee, MD    Onset Date/Surgical Date  10/04/18    Hand Dominance  Right    Next MD Visit  not scheduled      Precautions    Precautions  --   L AV fistula & peritoneal dialysis catheter   Precaution Comments  No lifting over 10 pounds      Balance Screen   Has the patient fallen in the past 6 months  No    Has the patient had a decrease in activity level because of a fear of falling?   No    Is the patient reluctant to leave their home because of a fear of falling?   No      Observation/Other Assessments   Observations  well-healed AV fistula site over L arm    Focus on Therapeutic Outcomes (FOTO)   50% limitation      Sensation   Light Touch  Appears Intact      Coordination   Gross Motor Movements are Fluid and Coordinated  Yes                   [facility] Adult PT Treatment/Exercise - 03/13/19 0001      Exercises   Exercises  Shoulder      Shoulder Exercises: Supine   External Rotation  AAROM;Left;15 reps    Flexion  AAROM;Left;15 reps      Shoulder Exercises: Seated   Other Seated Exercises  table slides: (flexion, scaption, ER all x 15 reps holding 5 seconds)      Shoulder Exercises: Standing   Other Standing Exercises  corner stretch x 10 seconds x 3 reps      Modalities   Modalities  Cryotherapy      Cryotherapy   Number Minutes Cryotherapy  10 Minutes    Cryotherapy Location  Shoulder    Type of Cryotherapy  Ice pack      Manual Therapy   Manual Therapy  Joint mobilization;Soft tissue mobilization;Passive ROM    Joint Mobilization  scapular mobilization    Soft tissue mobilization  anterior deltoid, lateral shoulder,    Passive ROM  Flexion, ER             PT Education - 03/13/19 1636    Education Details  reviewed HEP and added table slides for flexion, scaption and ER    Person(s) Educated  Patient    Methods  Explanation;Demonstration    Comprehension  Verbalized understanding;Returned demonstration       PT Short Term Goals - 03/13/19 1704      PT SHORT TERM GOAL #1   Title  Patient to be indpendent with initial HEP.    Time  4    Period  Weeks  Status  On-going        PT Long Term Goals - 03/06/19 1742      PT LONG TERM GOAL #1   Title  Patient to be independent with advanced HEP.    Time  8    Period  Weeks    Status  New    Target Date  05/01/19      PT LONG TERM GOAL #2   Title  Patient to demonstrate L shoulder AROM WFL and without pain limiting.    Time  8    Period  Weeks    Status  New    Target Date  05/01/19      PT LONG TERM GOAL #3   Title  Patient to demonstrate L shoulder strength >/=4+/5.    Time  8    Period  Weeks    Status  New    Target Date  05/01/19      PT LONG TERM GOAL #4   Title  Patient to report ability to reach her back with L UE in order to improve ease of dressing and grooming.    Time  8    Period  Weeks    Status  New    Target Date  05/01/19      PT LONG TERM GOAL #5   Title  Patient to report 75% improvement in sleeping tolerance d/t L shoulder pain.    Time  8    Period  Weeks    Status  New    Target Date  05/01/19            Plan - 03/13/19 1644    Clinical Impression Statement  Pt arriving to therpay today reporting 8/10 pain in left shoulder more lateral aspect. Pt with lifting restrictions due to AV fistuala in L UE of 10 pounds. Pt reviewed all Home exercises issued at initial evalaution. Pt reporting door stretching was painful so pt was instructed to only hold stretch for 10 seconds to see if that was better. We added table slides for flexion, scaption, and ER to HEP. Pt tolerated PROM and STW well and reported mild decrease in pain at end of session. Continue skilled PT.    Personal Factors and Comorbidities  Sex;Comorbidity 3+;Profession;Fitness;Past/Current Experience;Time since onset of injury/illness/exacerbation    Comorbidities  seizures, HTN, HA, anemia, bunionectomy, L AV fistula- ESRD on hemodialysis    Examination-Activity Limitations  Sleep;Bed Mobility;Caring for Others;Carry;Dressing;Hygiene/Grooming;Lift;Reach Overhead    Examination-Participation  Restrictions  Cleaning;Shop;Community Activity;Driving;Yard Work;Interpersonal Relationship;Laundry;Meal Prep    Rehab Potential  Good    PT Frequency  1x / week    PT Duration  8 weeks    PT Treatment/Interventions  ADLs/Self Care Home Management;Cryotherapy;Electrical Stimulation;Iontophoresis 4mg /ml Dexamethasone;Moist Heat;Therapeutic exercise;Therapeutic activities;Functional mobility training;Ultrasound;Neuromuscular re-education;Cognitive remediation;Patient/family education;Manual techniques;Vasopneumatic Device;Taping;Energy conservation;Dry needling;Passive range of motion;Scar mobilization    PT Next Visit Plan  shoulder ROM, gentle exercises    PT Home Exercise Plan  Access Code: [email or web address]    Consulted and Agree with Plan of Care  Patient       Patient will benefit from skilled therapeutic intervention in order to improve the following deficits and impairments:  Hypomobility, Decreased activity tolerance, Decreased strength, Increased fascial restricitons, Impaired UE functional use, Pain, Improper body mechanics, Decreased range of motion, Impaired flexibility, Postural dysfunction  Visit Diagnosis: Chronic left shoulder pain  Stiffness of left shoulder, not elsewhere classified  Muscle weakness (generalized)  Abnormal posture     Problem List Patient  Active Problem List   Diagnosis Date Noted  . Polycystic kidney disease 03/07/2019  . Chronic kidney disease (CKD), stage IV (severe) ([place]) 04/05/2018  . History of anemia due to CKD 07/26/2016  . SINUSITIS- ACUTE-NOS 05/22/2008    Oretha Caprice, PT 03/13/2019, 5:11 PM  [facility] [address]  [place], [place], [address] Phone: [phone]   Fax:  [phone]  Name: Sharon [name] MRN: [id number] Date of Birth: 1976-04-06

## 2019-03-20 ENCOUNTER — Encounter: Payer: Self-pay | Admitting: Physical Therapy

## 2019-03-20 ENCOUNTER — Ambulatory Visit: Payer: PRIVATE HEALTH INSURANCE | Admitting: Physical Therapy

## 2019-03-20 ENCOUNTER — Other Ambulatory Visit: Payer: Self-pay

## 2019-03-20 DIAGNOSIS — M25612 Stiffness of left shoulder, not elsewhere classified: Secondary | ICD-10-CM

## 2019-03-20 DIAGNOSIS — M25512 Pain in left shoulder: Secondary | ICD-10-CM

## 2019-03-20 DIAGNOSIS — G8929 Other chronic pain: Secondary | ICD-10-CM

## 2019-03-20 DIAGNOSIS — R293 Abnormal posture: Secondary | ICD-10-CM

## 2019-03-20 DIAGNOSIS — M6281 Muscle weakness (generalized): Secondary | ICD-10-CM

## 2019-03-20 NOTE — Therapy (Addendum)
[facility] [address]  [place], [place], [address] Phone: [phone]   Fax:  [phone]  Physical Therapy Treatment  Patient Details  Name: Sharon [name] MRN: [id number] Date of Birth: 1975/12/13 Referring Provider (PT): Otelia Santee, MD   Encounter Date: 03/20/2019  PT End of Session - 03/20/19 1539    Visit Number  3    Number of Visits  9    Date for PT Re-Evaluation  05/01/19    Authorization Type  [employer]    PT Start Time  1445    PT Stop Time  1532    PT Time Calculation (min)  47 min    Activity Tolerance  Patient tolerated treatment well;Patient limited by pain    Behavior During Therapy  [facility] for tasks assessed/performed       Past Medical History:  Diagnosis Date  . Anemia   . Contact lens/glasses fitting   . Headache    MIGRAINES AND TENSION  . Heart murmur    was told  years ago that she had a murmur, but no problems  . Hyperplasia, parathyroid ([place])   . Hypertension   . Hyponatremia   . Polycystic kidney disease, congenital    [name]. MD. w/visits 2-3 times yrly - Stage 5 CKD  . PONV (postoperative nausea and vomiting)    after hysteroscopy in Feb. 2020  . Seizures ([place])    " as a child only"    Past Surgical History:  Procedure Laterality Date  . AV FISTULA PLACEMENT Left 02/24/2018   Procedure: ARTERIOVENOUS (AV) FISTULA CREATION LEFT ARM;  Surgeon: Rosetta Posner, MD;  [facility] OR;  Service: Vascular;  Laterality: Left;  . AV FISTULA PLACEMENT Left 08/21/2018   Procedure: CREATION OF BRACHIOBASILIC ARTERIOVENOUS FISTULA  LEFT ARM;  Surgeon: Rosetta Posner, MD;  Location: [facility];  Service: Vascular;  Laterality: Left;  . BASCILIC VEIN TRANSPOSITION Left 10/12/2018   Procedure: BASCILIC VEIN TRANSPOSITION;  Surgeon: Rosetta Posner, MD;  Location: [facility];  Service: Vascular;  Laterality: Left;  . BUNIONECTOMY     AS CHILD  . COLONSCOPY  2014  . DILATATION &  CURETTAGE/HYSTEROSCOPY WITH MYOSURE N/A 06/02/2018   Procedure: [place];  Surgeon: Servando Salina, MD;  Location: [facility];  Service: Gynecology;  Laterality: N/A;  . HERNIA REPAIR  [id number]   UMBILICAL  . HYSTEROSCOPY W/D&C N/A 10/23/2012   Procedure: DILATATION AND CURETTAGE / Diagnostic HYSTEROSCOPY;  Surgeon: Marvene Staff, MD;  Location: [facility];  Service: Gynecology;  Laterality: N/A;  . INSERTION OF DIALYSIS CATHETER N/A 08/21/2018   Procedure: INSERTION OFTUNNELED  DIALYSIS CATHETER;  Surgeon: Rosetta Posner, MD;  Location: [facility] OR;  Service: Vascular;  Laterality: N/A;  . IR RADIOLOGIST EVAL & MGMT  06/27/2018  . MOUTH SURGERY  AS TEENAGER  . ROBOTIC ASSISTED LAPAROSCOPIC LYSIS OF ADHESION N/A 10/23/2012   Procedure: ROBOTIC ASSISTED  excision OF stage 2 pelvic ENDOMETRIOSIS;  Surgeon: Marvene Staff, MD;  Location: [facility];  Service: Gynecology;  Laterality: N/A;    There were no vitals filed for this visit.  Subjective Assessment - 03/20/19 1446    Subjective  Having some pain with 2 exercises and first thing in AM.    Pertinent History  seizures, HTN, HA, anemia, bunionectomy, L AV fistula- ESRD on hemodialysis    Diagnostic tests  none recent    Patient Stated Goals  get  more ROM in this arm    Currently in Pain?  Yes    Pain Score  7     Pain Location  Shoulder    Pain Orientation  Left    Pain Descriptors / Indicators  Aching    Pain Type  Acute pain                       [facility] Adult PT Treatment/Exercise - 03/20/19 0001      Shoulder Exercises: Supine   Protraction  Strengthening;Both;10 reps    Protraction Limitations  with wand; to tolerance    External Rotation  AAROM;Left;10 reps    External Rotation Limitations  with wand; elbow elevated on towel roll   advised to stop before pain   Flexion  AAROM;Left;10 reps    Flexion Limitations  with wand to tolerance    ABduction   AAROM;Left;10 reps    ABduction Limitations  with wand to tolerance      Shoulder Exercises: Seated   Horizontal ABduction  Strengthening;Both;10 reps;Theraband    Theraband Level (Shoulder Horizontal ABduction)  Level 1 (Yellow)    Horizontal ABduction Limitations  5x sitting, 5x in supine   better tolerance in supine   External Rotation  Strengthening;Both;10 reps;Theraband    Theraband Level (Shoulder External Rotation)  Level 1 (Yellow)    External Rotation Limitations  cues for scap retraction      Shoulder Exercises: Standing   Extension  Strengthening;Both;10 reps;Theraband    Theraband Level (Shoulder Extension)  Level 2 (Red)    Extension Limitations  good form    Row  Strengthening;Both;10 reps;Theraband    Theraband Level (Shoulder Row)  Level 2 (Red)    Row Limitations  cues to stop at neutral      Shoulder Exercises: Pulleys   Flexion  3 minutes    Flexion Limitations  to tolerance    Scaption  3 minutes    Scaption Limitations  to tolerance      Shoulder Exercises: IT sales professional  30 seconds;2 reps    Corner Stretch Limitations  in doorway and in corner to tolerance   cues for posterior pelvic tilt to improve tolerance     Manual Therapy   Manual Therapy  Passive ROM    Manual therapy comments  supine    Passive ROM  L shoulder PROM in all planes to tolerance   empty end feel d/t pain at end ranges            PT Education - 03/20/19 1538    Education Details  update to HEP; omitted pec stretch d/t pain; edu on how patient's symptoms differ from adhesive capsulitis    Person(s) Educated  Patient    Methods  Explanation;Demonstration;Tactile cues;Verbal cues;Handout    Comprehension  Verbalized understanding;Returned demonstration       PT Short Term Goals - 03/20/19 1545      PT SHORT TERM GOAL #1   Title  Patient to be indpendent with initial HEP.    Time  4    Period  Weeks    Status  Achieved        PT Long Term Goals -  03/20/19 1546      PT LONG TERM GOAL #1   Title  Patient to be independent with advanced HEP.    Time  8    Period  Weeks    Status  On-going  PT LONG TERM GOAL #2   Title  Patient to demonstrate L shoulder AROM WFL and without pain limiting.    Time  8    Period  Weeks    Status  On-going      PT LONG TERM GOAL #3   Title  Patient to demonstrate L shoulder strength >/=4+/5.    Time  8    Period  Weeks    Status  On-going      PT LONG TERM GOAL #4   Title  Patient to report ability to reach her back with L UE in order to improve ease of dressing and grooming.    Time  8    Period  Weeks    Status  On-going      PT LONG TERM GOAL #5   Title  Patient to report 75% improvement in sleeping tolerance d/t L shoulder pain.    Time  8    Period  Weeks    Status  On-going            Plan - 03/20/19 1540    Clinical Impression Statement  Patient reporting pain with 2 of her HEP exercises, however noting some small improvements in ROM thus far. Patient tolerated L shoulder PROM in all planes with empty end feel in all directions d/t pain. Thus, patient unlikely to have capsular-type pathology which was suspected on eval. Did educate patient on this fact and she reported understanding. Worked on reviewing HEP exercises that patient had concerns about with cues to avoid pushing into painful ranges. Also advised patient to omit pec stretch as patient with poor tolerance of this exercise altogether. Worked on gentle periscapular and RTC strengthening ther-ex this session with intermittent cues for proper form. Patient tolerated these exercises well, thus updated them into HEP. Patient reported understanding and with report of decreased pain at end of session compared to beginning of session. Patient progressing well per POC.    Personal Factors and Comorbidities  Sex;Comorbidity 3+;Profession;Fitness;Past/Current Experience;Time since onset of injury/illness/exacerbation     Comorbidities  seizures, HTN, HA, anemia, bunionectomy, L AV fistula- ESRD on hemodialysis    Examination-Activity Limitations  Sleep;Bed Mobility;Caring for Others;Carry;Dressing;Hygiene/Grooming;Lift;Reach Overhead    Examination-Participation Restrictions  Cleaning;Shop;Community Activity;Driving;Yard Work;Interpersonal Relationship;Laundry;Meal Prep    Rehab Potential  Good    PT Frequency  1x / week    PT Duration  8 weeks    PT Treatment/Interventions  ADLs/Self Care Home Management;Cryotherapy;Electrical Stimulation;Iontophoresis 46m/ml Dexamethasone;Moist Heat;Therapeutic exercise;Therapeutic activities;Functional mobility training;Ultrasound;Neuromuscular re-education;Cognitive remediation;Patient/family education;Manual techniques;Vasopneumatic Device;Taping;Energy conservation;Dry needling;Passive range of motion;Scar mobilization    PT Next Visit Plan  progress AROM and strengthening    PT Home Exercise Plan  Access Code: [email or web address]    Consulted and Agree with Plan of Care  Patient       Patient will benefit from skilled therapeutic intervention in order to improve the following deficits and impairments:  Hypomobility, Decreased activity tolerance, Decreased strength, Increased fascial restricitons, Impaired UE functional use, Pain, Improper body mechanics, Decreased range of motion, Impaired flexibility, Postural dysfunction  Visit Diagnosis: Chronic left shoulder pain  Stiffness of left shoulder, not elsewhere classified  Muscle weakness (generalized)  Abnormal posture     Problem List Patient Active Problem List   Diagnosis Date Noted  . Polycystic kidney disease 03/07/2019  . Chronic kidney disease (CKD), stage IV (severe) ([place] 04/05/2018  . History of anemia due to CKD 07/26/2016  . SINUSITIS- ACUTE-NOS 05/22/2008     YJanene Harvey PT,  DPT 03/20/19 3:49 PM   [facility] [address]  [place], [place], [address] Phone: [phone]   Fax:  [phone]  Name: Sharon [name] MRN: [id number] Date of Birth: Aug 07, 1975  PHYSICAL THERAPY DISCHARGE SUMMARY  Visits from Start of Care: 3  Current functional level related to goals / functional outcomes: Unable to assess; patient reporting that she was scheduled for kidney transplant    Remaining deficits: Unable to assess   Education / Equipment: HEP  Plan: Patient agrees to discharge.  Patient goals were not met. Patient is being discharged due to a change in medical status.  ?????     Janene Harvey, PT, DPT 04/18/19 2:38 PM

## 2019-03-27 ENCOUNTER — Ambulatory Visit: Payer: PRIVATE HEALTH INSURANCE | Admitting: Gastroenterology

## 2019-03-27 ENCOUNTER — Encounter: Payer: PRIVATE HEALTH INSURANCE | Admitting: Physical Therapy

## 2019-04-03 ENCOUNTER — Encounter: Payer: PRIVATE HEALTH INSURANCE | Admitting: Physical Therapy

## 2019-04-10 ENCOUNTER — Encounter: Payer: PRIVATE HEALTH INSURANCE | Admitting: Physical Therapy

## 2019-09-05 ENCOUNTER — Encounter: Payer: PRIVATE HEALTH INSURANCE | Admitting: Family Medicine

## 2020-05-13 ENCOUNTER — Other Ambulatory Visit: Payer: Self-pay | Admitting: Obstetrics and Gynecology

## 2020-05-13 DIAGNOSIS — E010 Iodine-deficiency related diffuse (endemic) goiter: Secondary | ICD-10-CM

## 2020-05-22 ENCOUNTER — Ambulatory Visit
Admission: RE | Admit: 2020-05-22 | Discharge: 2020-05-22 | Disposition: A | Discharge status: Home / Self Care | Payer: PRIVATE HEALTH INSURANCE | Source: Ambulatory Visit | Attending: Obstetrics and Gynecology | Admitting: Obstetrics and Gynecology

## 2020-05-22 DIAGNOSIS — E010 Iodine-deficiency related diffuse (endemic) goiter: Secondary | ICD-10-CM

## 2020-06-19 ENCOUNTER — Other Ambulatory Visit: Payer: Self-pay | Admitting: Endocrinology

## 2020-06-19 DIAGNOSIS — E042 Nontoxic multinodular goiter: Secondary | ICD-10-CM

## 2020-11-24 ENCOUNTER — Encounter (HOSPITAL_COMMUNITY): Payer: Self-pay

## 2020-12-01 ENCOUNTER — Ambulatory Visit
Admission: RE | Admit: 2020-12-01 | Discharge: 2020-12-01 | Disposition: A | Discharge status: Home / Self Care | Payer: No Typology Code available for payment source | Source: Ambulatory Visit | Attending: Endocrinology | Admitting: Endocrinology

## 2020-12-01 DIAGNOSIS — E042 Nontoxic multinodular goiter: Secondary | ICD-10-CM

## 2021-01-06 ENCOUNTER — Other Ambulatory Visit (HOSPITAL_COMMUNITY): Payer: Self-pay

## 2021-01-06 DIAGNOSIS — D849 Immunodeficiency, unspecified: Secondary | ICD-10-CM

## 2021-01-13 ENCOUNTER — Ambulatory Visit (INDEPENDENT_AMBULATORY_CARE_PROVIDER_SITE_OTHER): Payer: No Typology Code available for payment source

## 2021-01-13 ENCOUNTER — Other Ambulatory Visit: Payer: Self-pay

## 2021-01-13 DIAGNOSIS — Z298 Encounter for other specified prophylactic measures: Secondary | ICD-10-CM

## 2021-01-13 DIAGNOSIS — D849 Immunodeficiency, unspecified: Secondary | ICD-10-CM

## 2021-01-13 MED ORDER — CILGAVIMAB (PART OF EVUSHELD) INJECTION
300.0000 mg | Freq: Once | INTRAMUSCULAR | Status: AC
  Administered 2021-01-13: 300 mg via INTRAMUSCULAR
  Filled 2021-01-13: qty 3

## 2021-01-13 MED ORDER — METHYLPREDNISOLONE SODIUM SUCC 125 MG IJ SOLR: 125.0000 mg | Freq: Once | INTRAMUSCULAR | Status: AC | PRN

## 2021-01-13 MED ORDER — TIXAGEVIMAB (PART OF EVUSHELD) INJECTION
300.0000 mg | Freq: Once | INTRAMUSCULAR | Status: AC
  Administered 2021-01-13: 300 mg via INTRAMUSCULAR
  Filled 2021-01-13: qty 3

## 2021-01-13 MED ORDER — DIPHENHYDRAMINE HCL 50 MG/ML IJ SOLN: 50.0000 mg | Freq: Once | INTRAMUSCULAR | Status: AC | PRN

## 2021-01-13 MED ORDER — EPINEPHRINE 0.3 MG/0.3ML IJ SOAJ: 0.3000 mg | Freq: Once | INTRAMUSCULAR | Status: AC | PRN

## 2021-01-13 MED ORDER — ALBUTEROL SULFATE HFA 108 (90 BASE) MCG/ACT IN AERS: 2.0000 | INHALATION_SPRAY | Freq: Once | RESPIRATORY_TRACT | Status: AC | PRN

## 2021-01-13 NOTE — Patient Instructions (Signed)
Patient was given drug information sheet for Evusheld.  Also given cost estimate sheet for Evusheld.  Patient reviewed documentation and questions were answered.  Patient would like to proceed with treatment at this time.

## 2021-01-13 NOTE — Progress Notes (Signed)
Diagnosis: Pre-COVID Exposure Prophylaxis  Provider:  Marshell Garfinkel, MD  Procedure: Injection  Evusheld, Dose: 300 mg , Site: intramuscular 1 in left buttocks, 1 in right buttocks   Discharge: Condition: Good, Destination: Home . AVS provided to patient.   Performed by:  Saiya Crist, Sherlon Handing, LPN

## 2021-07-09 ENCOUNTER — Telehealth: Payer: Medicare Other | Admitting: Physician Assistant

## 2021-07-09 DIAGNOSIS — H00014 Hordeolum externum left upper eyelid: Secondary | ICD-10-CM | POA: Diagnosis not present

## 2021-07-09 MED ORDER — POLYMYXIN B-TRIMETHOPRIM 10000-0.1 UNIT/ML-% OP SOLN: OPHTHALMIC | 0 refills | Status: DC

## 2021-07-09 NOTE — Patient Instructions (Signed)
?  Takirah Dabbs, thank you for joining Leeanne Rio, PA-C for today's virtual visit.  While this provider is not your primary care provider (PCP), if your PCP is located in our provider database this encounter information will be shared with them immediately following your visit. ? ?Consent: ?(Patient) Sharon [name] provided verbal consent for this virtual visit at the beginning of the encounter. ? ?Current Medications: ? ?Current Outpatient Medications:  ?  BELATACEPT IV, , Disp: , Rfl:  ?  mycophenolate (MYFORTIC) 180 MG EC tablet, Take by mouth., Disp: , Rfl:  ?  diphenhydrAMINE (BENADRYL) 25 mg capsule, Take 25 mg by mouth as needed., Disp: , Rfl:  ? ?Current Facility-Administered Medications:  ?  albuterol (VENTOLIN HFA) 108 (90 Base) MCG/ACT inhaler 2 puff, 2 puff, Inhalation, Once PRN, Madelon Lips, MD ?  diphenhydrAMINE (BENADRYL) injection 50 mg, 50 mg, Intramuscular, Once PRN, Madelon Lips, MD ?  EPINEPHrine (EPI-PEN) injection 0.3 mg, 0.3 mg, Intramuscular, Once PRN, Madelon Lips, MD ?  methylPREDNISolone sodium succinate (SOLU-MEDROL) 125 mg/2 mL injection 125 mg, 125 mg, Intramuscular, Once PRN, Madelon Lips, MD  ? ?Medications ordered in this encounter:  ?No orders of the defined types were placed in this encounter. ?  ? ?*If you need refills on other medications prior to your next appointment, please contact your pharmacy* ? ?Follow-Up: ?Call back or seek an in-person evaluation if the symptoms worsen or if the condition fails to improve as anticipated. ? ?Other Instructions ?Keep hands clean. Avoid touching eyelids. ?Apply warm compresses a few times per day to help promote drainage and resolution of stye. ?Use the antibiotic drops as directed. ?Avoid wearing contacts until infection has cleared. ? ?If not resolving or any new/worsening symptoms despite treatment, you need to be evaluated in person.  ? ? ?If you have been instructed to have an in-person evaluation today at a  local Urgent Care facility, please use the link below. It will take you to a list of all of our available [facility], including address, phone number and hours of operation. Please do not delay care.  ?[facility] ? ?If you or a family member do not have a primary care provider, use the link below to schedule a visit and establish care. When you choose a [facility] primary care physician or advanced practice provider, you gain a long-term partner in health. ?Find a Primary Care Provider ? ?Learn more about [facility]'s in-office and virtual care options: ?[facility]  ?

## 2021-07-09 NOTE — Progress Notes (Signed)
?Virtual Visit Consent  ? ?Sharon [name], you are scheduled for a virtual visit with a [facility] provider today.   ?  ?Just as with appointments in the office, your consent must be obtained to participate.  Your consent will be active for this visit and any virtual visit you may have with one of our providers in the next 365 days.   ?  ?If you have a MyChart account, a copy of this consent can be sent to you electronically.  All virtual visits are billed to your insurance company just like a traditional visit in the office.   ? ?As this is a virtual visit, video technology does not allow for your provider to perform a traditional examination.  This may limit your provider's ability to fully assess your condition.  If your provider identifies any concerns that need to be evaluated in person or the need to arrange testing (such as labs, EKG, etc.), we will make arrangements to do so.   ?  ?Although advances in technology are sophisticated, we cannot ensure that it will always work on either your end or our end.  If the connection with a video visit is poor, the visit may have to be switched to a telephone visit.  With either a video or telephone visit, we are not always able to ensure that we have a secure connection.    ? ?I need to obtain your verbal consent now.   Are you willing to proceed with your visit today?  ?  ?Sharon [name] has provided verbal consent on 07/09/2021 for a virtual visit (video or telephone). ?  ?Leeanne Rio, PA-C  ? ?Date: 07/09/2021 11:14 AM ? ? ?Virtual Visit via Video Note  ? ?Sharon [name], connected with  Sharon [name]  ([id number], 12/03/1975) on 07/09/21 at 11:00 AM EDT by a video-enabled telemedicine application and verified that I am speaking with the correct person using two identifiers. ? ?Location: ?Patient: Virtual Visit Location Patient: Home ?Provider: Virtual Visit Location Provider: Home Office ?  ?I discussed the limitations of evaluation and management by  telemedicine and the availability of in person appointments. The patient expressed understanding and agreed to proceed.   ? ?History of Present Illness: ?Sharon [name] is a 46 y.o. who identifies as a female who was assigned female at birth, and is being seen today for possible stye of her L upper eye with redness, tenderness and ongoing drainage. Denies eye redness, eye pain, vision change. Denies fever, chills, malaise. Is a renal transplant recipient and on immunosuppressive medications. She has been applying warm compresses but worried about risk of infection.  ? ?HPI: HPI  ?Problems:  ?Patient Active Problem List  ? Diagnosis Date Noted  ? Polycystic kidney disease 03/07/2019  ? Chronic kidney disease (CKD), stage IV (severe) ([place]) 04/05/2018  ? History of anemia due to CKD 07/26/2016  ? SINUSITIS- ACUTE-NOS 05/22/2008  ?  ?Allergies:  ?Allergies  ?Allergen Reactions  ? Penicillins Anaphylaxis  ?  Did it involve swelling of the face/tongue/throat, SOB, or low BP? Yes ?Did it involve sudden or severe rash/hives, skin peeling, or any reaction on the inside of your mouth or nose? No ?Did you need to seek medical attention at a hospital or doctor's office? Yes ?When did it last happen?      Childhood allergy ?If all above answers are ?NO?, may proceed with cephalosporin use. ?  ? Other   ?  NO BLOOD PRODUCTS ?> NO EXPLANATION [RELIGIOUS  PREFERENCE?]  ? ?Medications:  ?Current Outpatient Medications:  ?  BELATACEPT IV, , Disp: , Rfl:  ?  mycophenolate (MYFORTIC) 180 MG EC tablet, Take by mouth., Disp: , Rfl:  ?  trimethoprim-polymyxin b (POLYTRIM) ophthalmic solution, Apply 1-2 drops into affected eye QID x 5 days., Disp: 10 mL, Rfl: 0 ?  diphenhydrAMINE (BENADRYL) 25 mg capsule, Take 25 mg by mouth as needed., Disp: , Rfl:  ? ?Current Facility-Administered Medications:  ?  albuterol (VENTOLIN HFA) 108 (90 Base) MCG/ACT inhaler 2 puff, 2 puff, Inhalation, Once PRN, Madelon Lips, MD ?  diphenhydrAMINE (BENADRYL)  injection 50 mg, 50 mg, Intramuscular, Once PRN, Madelon Lips, MD ?  EPINEPHrine (EPI-PEN) injection 0.3 mg, 0.3 mg, Intramuscular, Once PRN, Madelon Lips, MD ?  methylPREDNISolone sodium succinate (SOLU-MEDROL) 125 mg/2 mL injection 125 mg, 125 mg, Intramuscular, Once PRN, Madelon Lips, MD ? ?Observations/Objective: ?Patient is well-developed, well-nourished in no acute distress.  ?Resting comfortably at home.  ?Head is normocephalic, atraumatic.  ?No labored breathing. ?Speech is clear and coherent with logical content.  ?Patient is alert and oriented at baseline.  ?L upper eyelid with mild redness and swelling.  ? ?Assessment and Plan: ?1. Hordeolum externum of left upper eyelid ?- trimethoprim-polymyxin b (POLYTRIM) ophthalmic solution; Apply 1-2 drops into affected eye QID x 5 days.  Dispense: 10 mL; Refill: 0 ? ?Start polytrim. Continue warm compresses. Supportive measures and OTC medications reviewed. Follow-up precautions discussed.  ? ?Follow Up Instructions: ?I discussed the assessment and treatment plan with the patient. The patient was provided an opportunity to ask questions and all were answered. The patient agreed with the plan and demonstrated an understanding of the instructions.  A copy of instructions were sent to the patient via MyChart unless otherwise noted below.  ? ?The patient was advised to call back or seek an in-person evaluation if the symptoms worsen or if the condition fails to improve as anticipated. ? ?Time:  ?I spent 10 minutes with the patient via telehealth technology discussing the above problems/concerns.   ? ?Leeanne Rio, PA-C ?

## 2021-07-31 IMAGING — US US THYROID
1 series · 13 of 25 positions shown · non-contrast
Comparison: None.

CLINICAL DATA: Thyromegaly on physical examination

EXAM:
THYROID ULTRASOUND
TECHNIQUE: Ultrasound examination of the thyroid gland and adjacent soft
tissues was performed.

[Series 1: us thyroid · 0.08mm/px · 76 acquisitions, 13 frames shown]
[im 1/76]
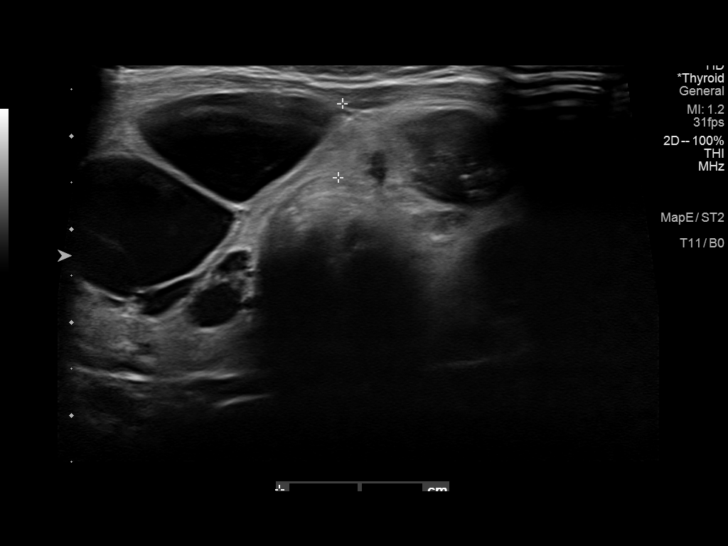
[im 7/76]
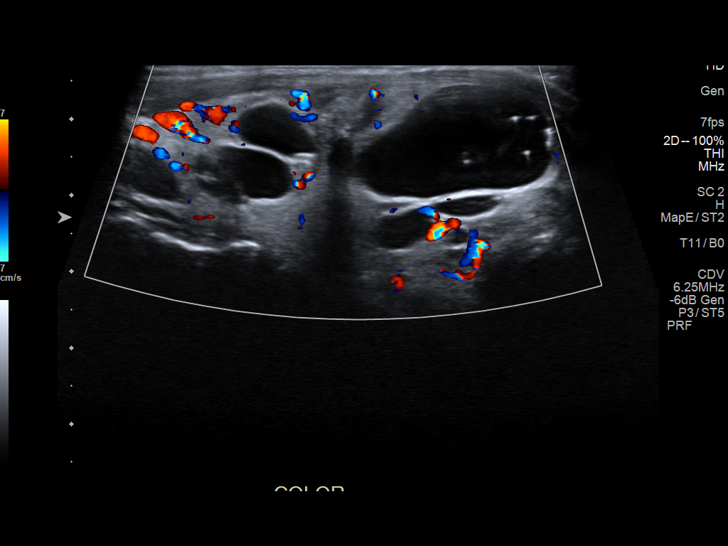
[im 13/76]
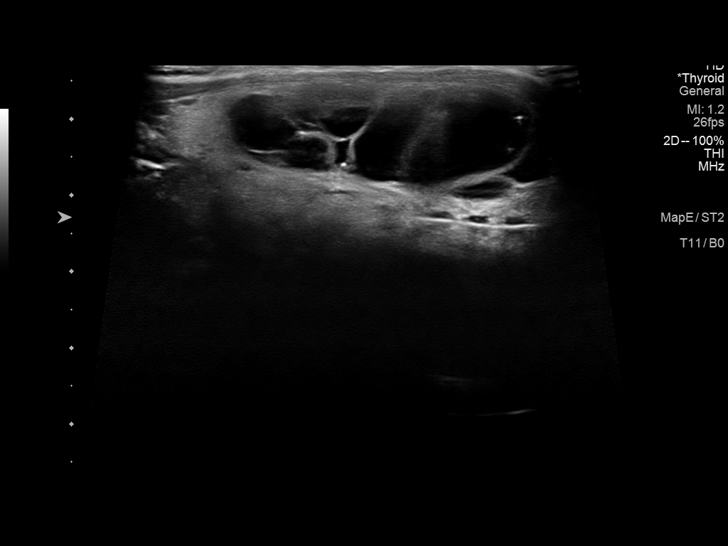
[im 19/76]
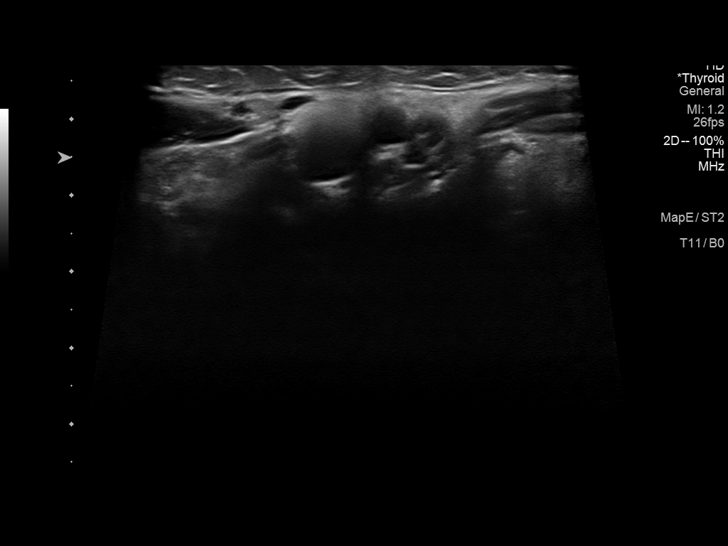
[im 26/76]
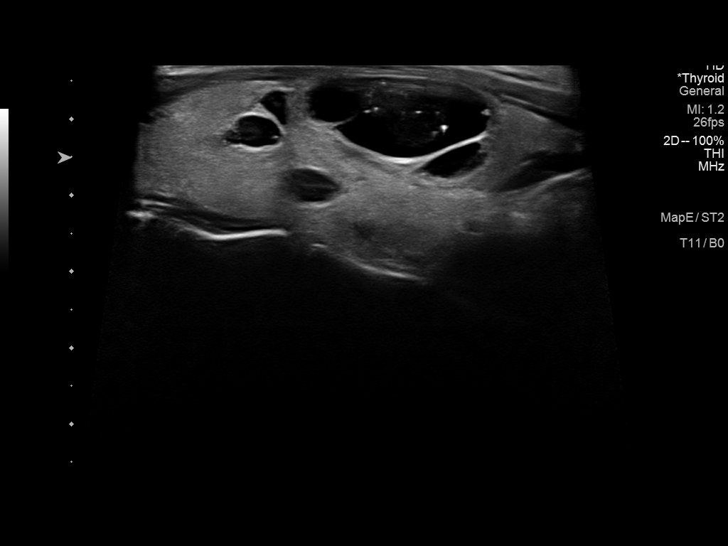
[im 32/76]
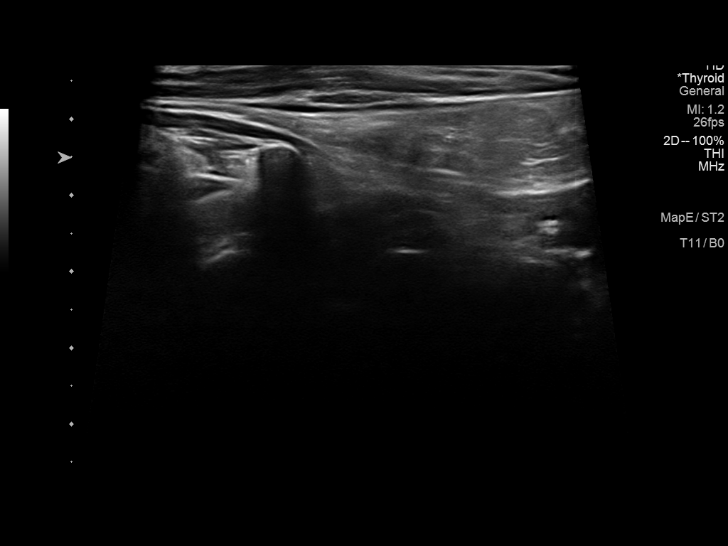
[im 38/76]
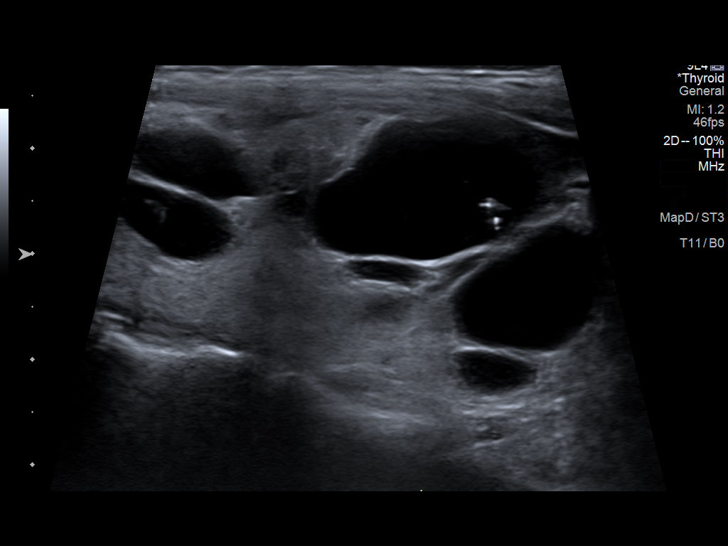
[im 44/76]
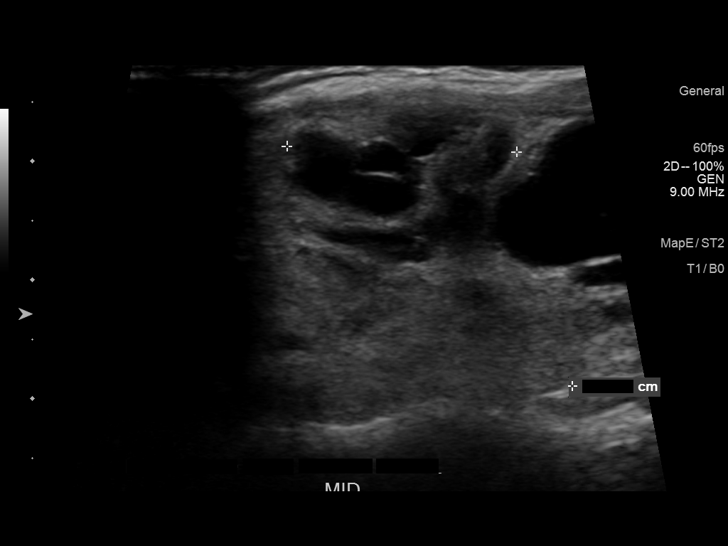
[im 51/76]
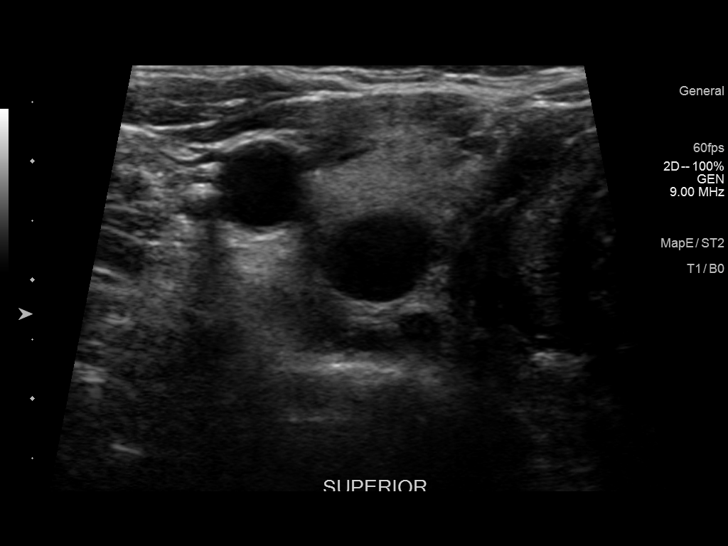
[im 57/76]
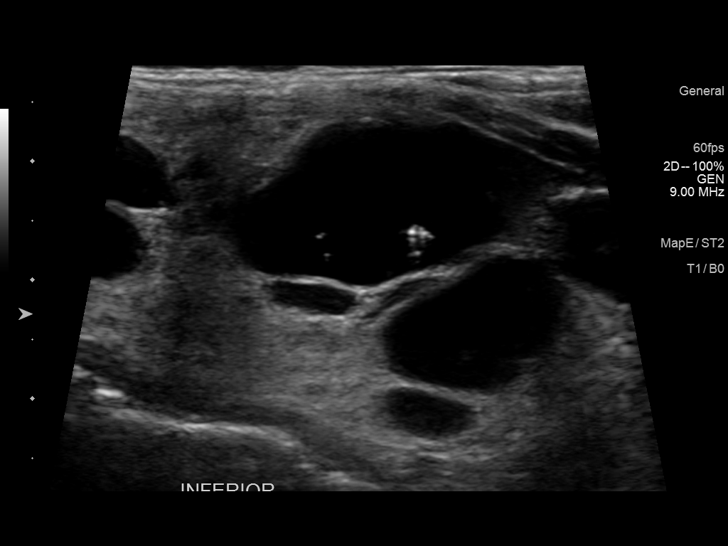
[im 63/76]
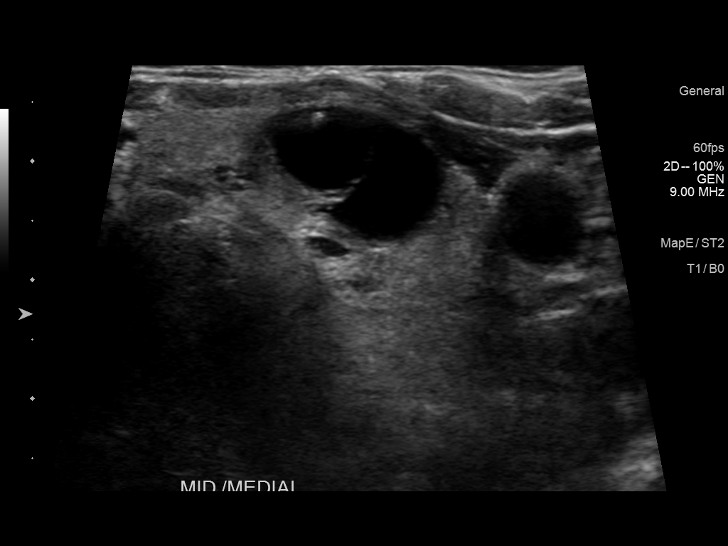
[im 69/76]
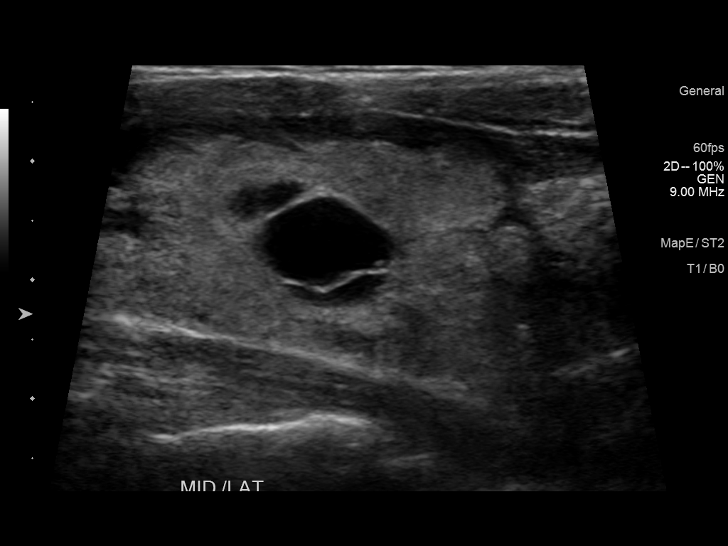
[im 76/76]
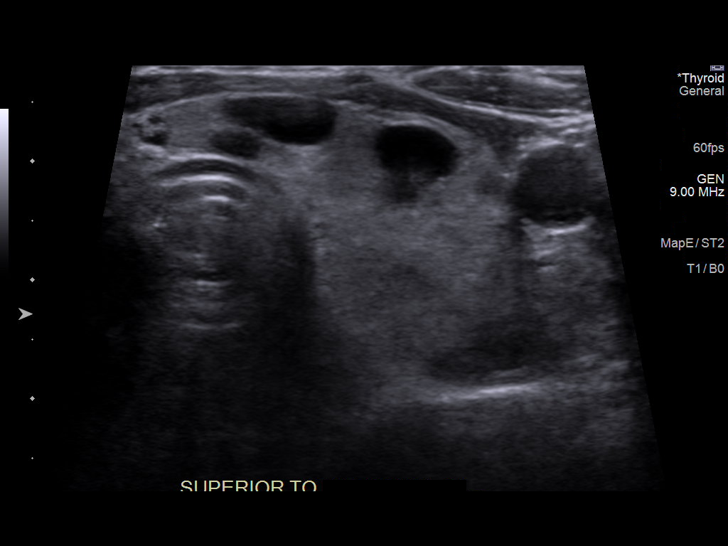

[13 of 25 positions shown; findings below may reference images not displayed]

FINDINGS: Parenchymal Echotexture: Mildly heterogeneous

Isthmus: 0.8 cm

Right lobe: 6.4 x 2.5 x 2.5 cm

Left lobe: 6.4 x 2.5 x 1.8 cm

_________________________________________________________

Estimated total number of nodules >/= 1 cm: 7

Number of spongiform nodules >/=  2 cm not described below (TR1): 0

Number of mixed cystic and solid nodules >/= 1.5 cm not described
below (TR2): 1

_________________________________________________________

Nodules 1, 3, and 4 located in the right thyroid lobe are cystic and
do not meet criteria for imaging follow-up or FNA.

Nodule 2 located in the mid right thyroid lobe measuring 1.9 x 1.9 x
1.3 cm is mixed solid cystic does not meet criteria for imaging
follow-up or FNA.

Nodules 5, 6, and 7 located in the left thyroid lobe are cystic and
not meet criteria for FNA or imaging follow-up.

Nodule # 8:

Location: Left; mid

Maximum size: 0.9 cm; Other 2 dimensions: 0.7 x 0.5 cm

Composition: solid/almost completely solid (2)

Echogenicity: hypoechoic (2)

Shape: not taller-than-wide (0)

Margins: smooth (0)

Echogenic foci: none (0)

ACR TI-RADS total points: 4.

ACR TI-RADS risk category: TR4 (4-6 points).

ACR TI-RADS recommendations:

Given size (<0.9 cm) and appearance, this nodule does NOT meet
TI-RADS criteria for biopsy or dedicated follow-up.

_________________________________________________________
IMPRESSION: Numerous bilateral thyroid nodules which are predominantly cystic
and do not meet criteria for imaging follow-up.

The above is in keeping with the ACR TI-RADS recommendations - [HOSPITAL] 2400;[DATE].

## 2021-08-10 ENCOUNTER — Other Ambulatory Visit: Payer: Self-pay | Admitting: Nephrology

## 2021-08-10 DIAGNOSIS — Z940 Kidney transplant status: Secondary | ICD-10-CM

## 2021-09-02 ENCOUNTER — Ambulatory Visit
Admission: RE | Admit: 2021-09-02 | Discharge: 2021-09-02 | Disposition: A | Discharge status: Home / Self Care | Payer: Medicare Other | Source: Ambulatory Visit | Attending: Nephrology | Admitting: Nephrology

## 2021-09-02 DIAGNOSIS — Z940 Kidney transplant status: Secondary | ICD-10-CM

## 2021-10-13 ENCOUNTER — Other Ambulatory Visit: Payer: Self-pay | Admitting: Endocrinology

## 2021-10-13 DIAGNOSIS — E042 Nontoxic multinodular goiter: Secondary | ICD-10-CM

## 2021-11-02 ENCOUNTER — Telehealth: Payer: No Typology Code available for payment source | Admitting: Physician Assistant

## 2021-11-02 DIAGNOSIS — H00015 Hordeolum externum left lower eyelid: Secondary | ICD-10-CM | POA: Diagnosis not present

## 2021-11-02 MED ORDER — POLYMYXIN B-TRIMETHOPRIM 10000-0.1 UNIT/ML-% OP SOLN: OPHTHALMIC | 0 refills | Status: AC

## 2021-11-02 NOTE — Progress Notes (Signed)
  E-Visit for Stye   We are sorry that you are not feeling well. Here is how we plan to help!  Based on what you have shared with me it looks like you have a stye.  A stye is an inflammation of the eyelid.  It is often a red, painful lump near the edge of the eyelid that may look like a boil or a pimple.  A stye develops when an infection occurs at the base of an eyelash.   We have made appropriate suggestions for you based upon your presentation: Your symptoms may indicate an stye and infection of the sclera.  The use of anti-inflammatory and antibiotic eye drops for a week will help resolve this condition.  I have sent in neomycin-polymyxin opthalmic suspension, two to three drops in the affected eye every 4 hours.  If your symptoms do not improve over the next two to three days you should be seen in your doctor's office.  HOME CARE:  Wash your hands often! Let the stye open on its own. Don't squeeze or open it. Don't rub your eyes. This can irritate your eyes and let in bacteria.  If you need to touch your eyes, wash your hands first. Don't wear eye makeup or contact lenses until the area has healed.  GET HELP RIGHT AWAY IF:  Your symptoms do not improve. You develop blurred or loss of vision. Your symptoms worsen (increased discharge, pain or redness).   Thank you for choosing an e-visit.  Your e-visit answers were reviewed by a board certified advanced clinical practitioner to complete your personal care plan. Depending upon the condition, your plan could have included both over the counter or prescription medications.  Please review your pharmacy choice. Make sure the pharmacy is open so you can pick up prescription now. If there is a problem, you may contact your provider through [employer] and have the prescription routed to another pharmacy.  Your safety is important to [place]. If you have drug allergies check your prescription carefully.   For the next 24 hours you can use  MyChart to ask questions about today's visit, request a non-urgent call back, or ask for a work or school excuse. You will get an email in the next two days asking about your experience. I hope that your e-visit has been valuable and will speed your recovery.  I provided 5 minutes of non face-to-face time during this encounter for chart review and documentation.

## 2021-12-09 ENCOUNTER — Ambulatory Visit
Admission: RE | Admit: 2021-12-09 | Discharge: 2021-12-09 | Disposition: A | Discharge status: Home / Self Care | Payer: Medicare Other | Source: Ambulatory Visit | Attending: Endocrinology | Admitting: Endocrinology

## 2021-12-09 DIAGNOSIS — E042 Nontoxic multinodular goiter: Secondary | ICD-10-CM

## 2022-02-21 ENCOUNTER — Ambulatory Visit
Admission: EM | Admit: 2022-02-21 | Discharge: 2022-02-21 | Disposition: A | Discharge status: Home / Self Care | Payer: Medicare Other | Attending: Urgent Care | Admitting: Urgent Care

## 2022-02-21 ENCOUNTER — Encounter: Payer: Self-pay | Admitting: *Deleted

## 2022-02-21 DIAGNOSIS — Z940 Kidney transplant status: Secondary | ICD-10-CM | POA: Diagnosis present

## 2022-02-21 DIAGNOSIS — N3001 Acute cystitis with hematuria: Secondary | ICD-10-CM | POA: Diagnosis present

## 2022-02-21 HISTORY — DX: Kidney transplant status: Z94.0

## 2022-02-21 LAB — POCT URINALYSIS DIP (MANUAL ENTRY)
Bilirubin, UA: NEGATIVE
Glucose, UA: NEGATIVE mg/dL
Ketones, POC UA: NEGATIVE mg/dL
Nitrite, UA: POSITIVE — AB
Protein Ur, POC: 100 mg/dL — AB
Spec Grav, UA: 1.015 (ref 1.010–1.025)
Urobilinogen, UA: 0.2 E.U./dL
pH, UA: 6 (ref 5.0–8.0)

## 2022-02-21 MED ORDER — CIPROFLOXACIN HCL 500 MG PO TABS: 500.0000 mg | ORAL_TABLET | Freq: Two times a day (BID) | ORAL | 0 refills | Status: AC

## 2022-02-21 NOTE — Discharge Instructions (Signed)
Please start ciprofloxacin to address an urinary tract infection. Make sure you hydrate very well with plain water and a quantity of 64 ounces of water a day.  Please limit drinks that are considered urinary irritants such as soda, sweet tea, coffee, energy drinks, alcohol.  These can worsen your urinary and genital symptoms but also be the source of them.  I will let you know about your urine culture results through MyChart to see if we need to prescribe or change your antibiotics based off of those results.  

## 2022-02-21 NOTE — ED Triage Notes (Addendum)
C/O dysuria and polyuria and general malaise onset yesterday with temp up to 100.9.  Pt has hx of kidney transplant; called her transplant team at [place] was told to go to [facility] or ED.

## 2022-02-21 NOTE — ED Provider Notes (Signed)
[facility]  Note:  This document was prepared using Systems analyst and may include unintentional dictation errors.  MRN: [id number] DOB: 26-Nov-1975  Subjective:   Sharon [name] is a 46 y.o. female presenting for 1 day history of acute onset dysuria, urinary frequency, malaise and fatigue.  She had a fever of 100.9 F.  Has a solitary kidney, history of kidney transplant.  Was advised by her transplant team to come in for a checkup on her urinary tract infection.  She just had blood work completed for her creatinine level.  Has a history of anaphylaxis with penicillins.  Chart review does not show that she has taken a cephalosporin before.  However, patient believes she has taken this in the past.   Current Facility-Administered Medications:    albuterol (VENTOLIN HFA) 108 (90 Base) MCG/ACT inhaler 2 puff, 2 puff, Inhalation, Once PRN, Madelon Lips, MD   diphenhydrAMINE (BENADRYL) injection 50 mg, 50 mg, Intramuscular, Once PRN, Madelon Lips, MD   EPINEPHrine (EPI-PEN) injection 0.3 mg, 0.3 mg, Intramuscular, Once PRN, Madelon Lips, MD   methylPREDNISolone sodium succinate (SOLU-MEDROL) 125 mg/2 mL injection 125 mg, 125 mg, Intramuscular, Once PRN, Madelon Lips, MD  Current Outpatient Medications:    BELATACEPT IV, , Disp: , Rfl:    diphenhydrAMINE (BENADRYL) 25 mg capsule, Take 25 mg by mouth as needed., Disp: , Rfl:    mycophenolate (MYFORTIC) 180 MG EC tablet, Take by mouth., Disp: , Rfl:    trimethoprim-polymyxin b (POLYTRIM) ophthalmic solution, Apply 1-2 drops into affected eye QID x 5 days., Disp: 10 mL, Rfl: 0   Allergies  Allergen Reactions   Penicillins Anaphylaxis    Did it involve swelling of the face/tongue/throat, SOB, or low BP? Yes Did it involve sudden or severe rash/hives, skin peeling, or any reaction on the inside of your mouth or nose? No Did you need to seek medical attention at a hospital or doctor's  office? Yes When did it last happen?      Childhood allergy If all above answers are "NO", may proceed with cephalosporin use.    Other     NO BLOOD PRODUCTS > NO EXPLANATION [RELIGIOUS PREFERENCE?]    Past Medical History:  Diagnosis Date   Anemia    Contact lens/glasses fitting    Headache    MIGRAINES AND TENSION   Heart murmur    was told  years ago that she had a murmur, but no problems   Hyperplasia, parathyroid (HCC)    Hypertension    Hyponatremia    Polycystic kidney disease, congenital    [name]. MD. w/visits 2-3 times yrly - Stage 5 CKD   PONV (postoperative nausea and vomiting)    after hysteroscopy in Feb. 2020   Seizures [facility])    " as a child only"     Past Surgical History:  Procedure Laterality Date   AV FISTULA PLACEMENT Left 02/24/2018   Procedure: ARTERIOVENOUS (AV) FISTULA CREATION LEFT ARM;  Surgeon: Rosetta Posner, MD;  Location: [facility];  Service: Vascular;  Laterality: Left;   AV FISTULA PLACEMENT Left 08/21/2018   Procedure: CREATION OF BRACHIOBASILIC ARTERIOVENOUS FISTULA  LEFT ARM;  Surgeon: Rosetta Posner, MD;  Location: [place];  Service: Vascular;  Laterality: Left;   [place] Left 10/12/2018   Procedure: BASCILIC VEIN TRANSPOSITION;  Surgeon: Rosetta Posner, MD;  Location: [place];  Service: Vascular;  Laterality: Left;   BUNIONECTOMY     AS  CHILD   COLONSCOPY  2014   DILATATION & CURETTAGE/HYSTEROSCOPY WITH MYOSURE N/A 06/02/2018   Procedure: [place];  Surgeon: Servando Salina, MD;  Location: [place];  Service: Gynecology;  Laterality: N/A;   HERNIA REPAIR  1829   UMBILICAL   HYSTEROSCOPY WITH D & C N/A 10/23/2012   Procedure: DILATATION AND CURETTAGE / Diagnostic HYSTEROSCOPY;  Surgeon: Marvene Staff, MD;  Location: [place] [facility];  Service: Gynecology;  Laterality: N/A;   INSERTION OF DIALYSIS CATHETER N/A 08/21/2018   Procedure: INSERTION OFTUNNELED  DIALYSIS  CATHETER;  Surgeon: Rosetta Posner, MD;  Location: [facility];  Service: Vascular;  Laterality: N/A;   IR RADIOLOGIST EVAL & MGMT  06/27/2018   MOUTH SURGERY  AS TEENAGER   ROBOTIC ASSISTED LAPAROSCOPIC LYSIS OF ADHESION N/A 10/23/2012   Procedure: ROBOTIC ASSISTED  excision OF stage 2 pelvic ENDOMETRIOSIS;  Surgeon: Marvene Staff, MD;  Location: [place] [facility];  Service: Gynecology;  Laterality: N/A;    Family History  Problem Relation Age of Onset   Hypertension Mother    Polycystic kidney disease Mother    Hypertension Father     Social History   Tobacco Use   Smoking status: Never   Smokeless tobacco: Never  Vaping Use   Vaping Use: Never used  Substance Use Topics   Alcohol use: Yes    Comment: occasional   Drug use: No    ROS   Objective:   Vitals: BP (!) 153/81 (BP Location: Right Arm)   Pulse 93   Temp 99.6 F (37.6 C) (Oral)   Resp 16   SpO2 98%   Physical Exam Constitutional:      General: She is not in acute distress.    Appearance: Normal appearance. She is well-developed. She is not ill-appearing, toxic-appearing or diaphoretic.  HENT:     Head: Normocephalic and atraumatic.     Nose: Nose normal.     Mouth/Throat:     Mouth: Mucous membranes are moist.  Eyes:     General: No scleral icterus.       Right eye: No discharge.        Left eye: No discharge.     Extraocular Movements: Extraocular movements intact.     Conjunctiva/sclera: Conjunctivae normal.  Cardiovascular:     Rate and Rhythm: Normal rate.  Pulmonary:     Effort: Pulmonary effort is normal.  Abdominal:     General: Bowel sounds are normal. There is no distension.     Palpations: Abdomen is soft. There is no mass.     Tenderness: There is abdominal tenderness (suprapubic, right sided). There is no right CVA tenderness, left CVA tenderness, guarding or rebound.  Skin:    General: Skin is warm and dry.  Neurological:     General: No focal deficit present.     Mental Status: She is alert  and oriented to person, place, and time.  Psychiatric:        Mood and Affect: Mood normal.        Behavior: Behavior normal.        Thought Content: Thought content normal.        Judgment: Judgment normal.     Results for orders placed or performed during the hospital encounter of 02/21/22 (from the past 24 hour(s))  POCT urinalysis dipstick     Status: Abnormal   Collection Time: 02/21/22  2:18 PM  Result Value Ref Range   Color, UA other (  A) yellow   Clarity, UA cloudy (A) clear   Glucose, UA negative negative mg/dL   Bilirubin, UA negative negative   Ketones, POC UA negative negative mg/dL   Spec Grav, UA 1.015 1.010 - 1.025   Blood, UA large (A) negative   pH, UA 6.0 5.0 - 8.0   Protein Ur, POC =100 (A) negative mg/dL   Urobilinogen, UA 0.2 0.2 or 1.0 E.U./dL   Nitrite, UA Positive (A) Negative   Leukocytes, UA Moderate (2+) (A) Negative    Assessment and Plan :   PDMP not reviewed this encounter.  1. Acute cystitis with hematuria   2. Kidney transplant recipient     Most recent creatinine level was used to calculate her creatinine clearance which was estimated at 52 - 52m/min. Start ciprofloxacin to cover for acute cystitis, urine culture pending.  Recommended aggressive hydration, limiting urinary irritants. Counseled patient on potential for adverse effects with medications prescribed/recommended today, ER and return-to-clinic precautions discussed, patient verbalized understanding.    MJaynee Eagles PVermont10/29/23 1514

## 2022-02-23 LAB — URINE CULTURE: Culture: >=100000 — AB

## 2022-11-11 IMAGING — CT CT ABD-PELV W/O CM
1 of 2 series · 14 of 32 positions shown, 18 images · non-contrast
Comparison: 06/25/2019

CLINICAL DATA: Right lower quadrant pain for 5 months, history of
right renal transplant 03/24/2019



[Series 2: abd/pelvis w/(date) · axial · 0.70mm/px · z∈[-507,-92]mm · 14 of 93 slices shown, 18 images]
[im 5/93  soft-tissue]
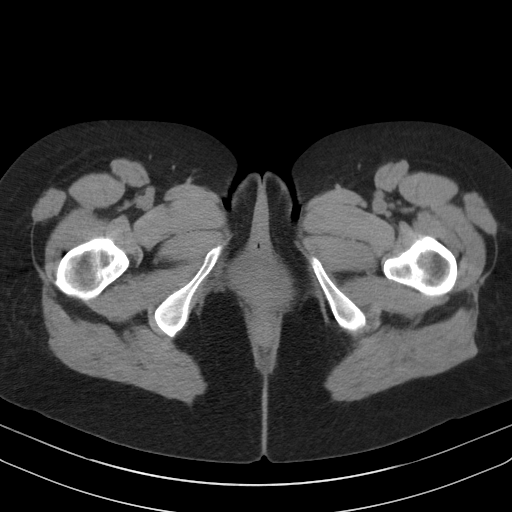
[im 5/93  bone]
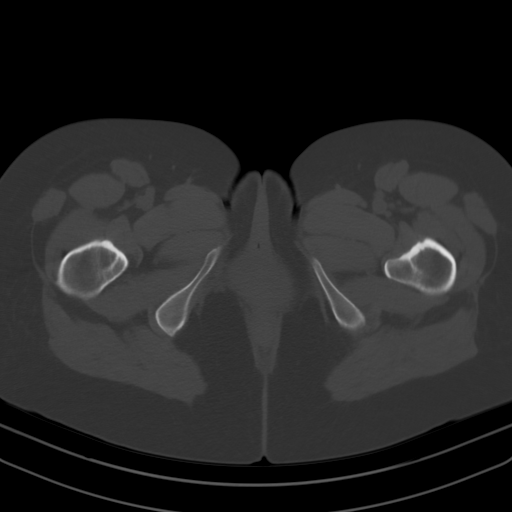
[im 14/93  soft-tissue]
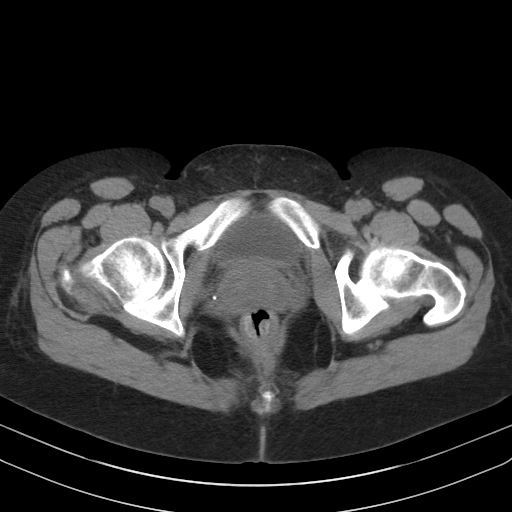
[im 22/93  soft-tissue]
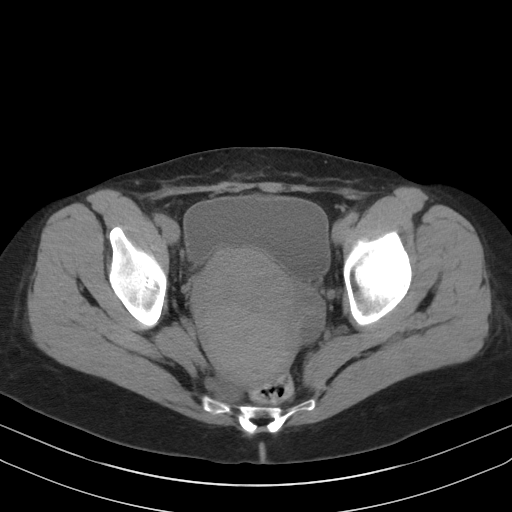
[im 27/93  soft-tissue]
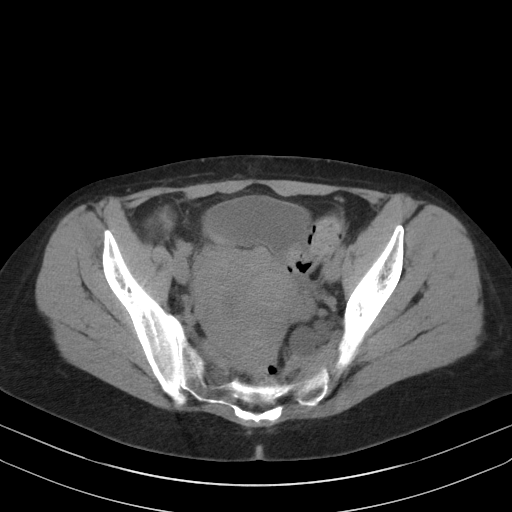
[im 36/93  soft-tissue]
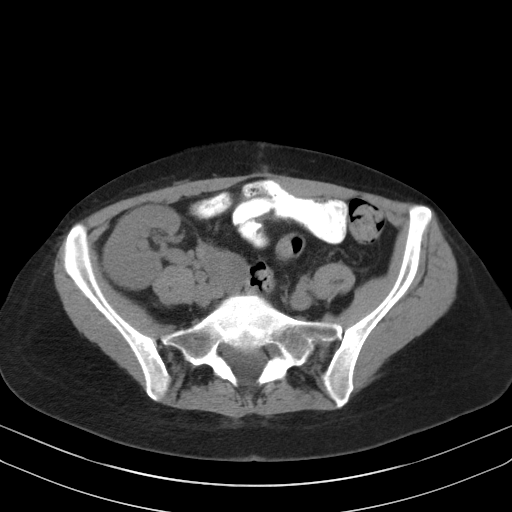
[im 44/93  soft-tissue]
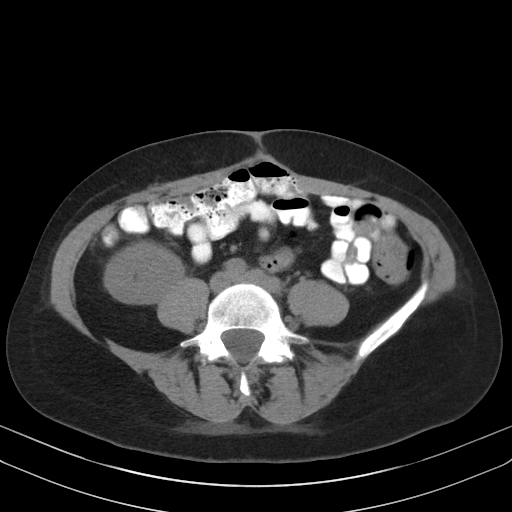
[im 49/93  soft-tissue]
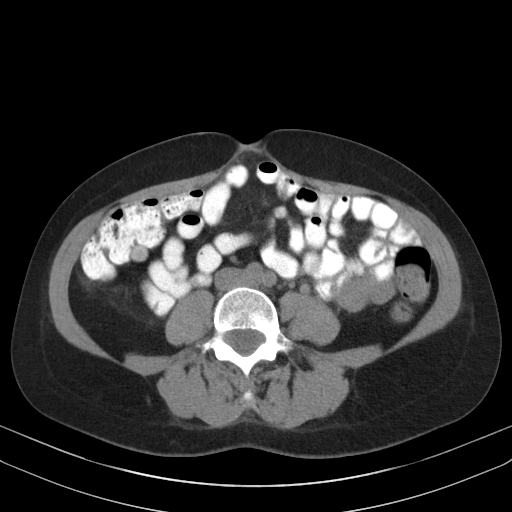
[im 57/93  soft-tissue]
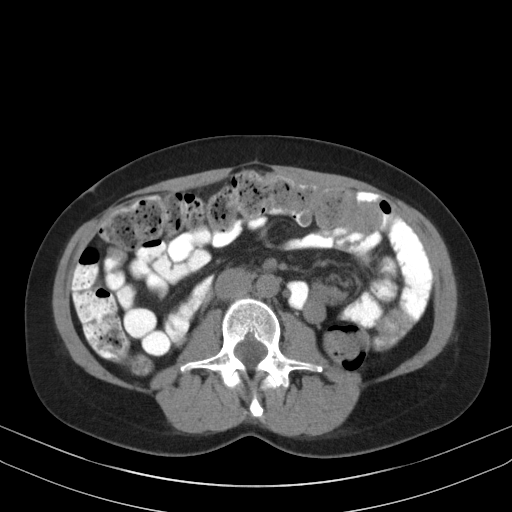
[im 66/93  soft-tissue]
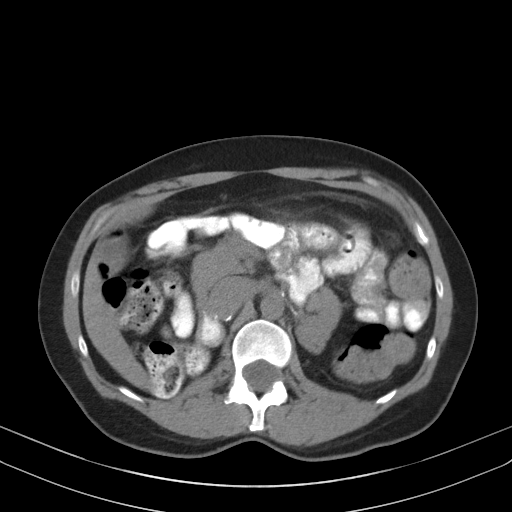
[im 66/93  bone]
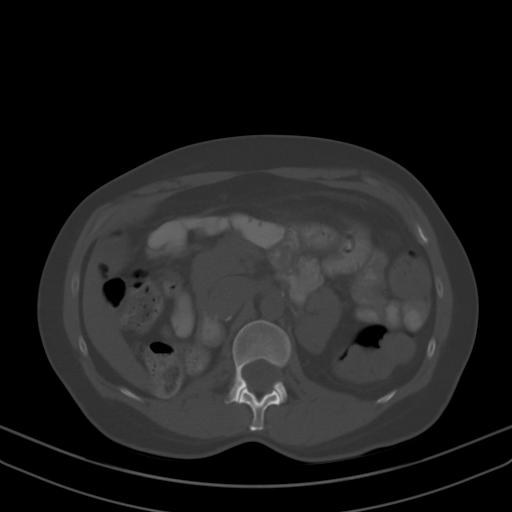
[im 71/93  soft-tissue]
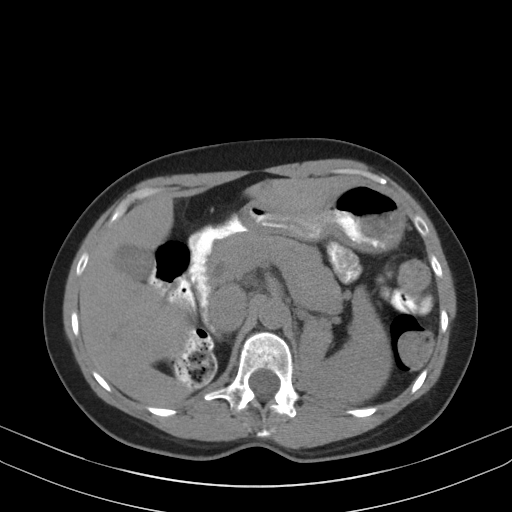
[im 75/93  lung]
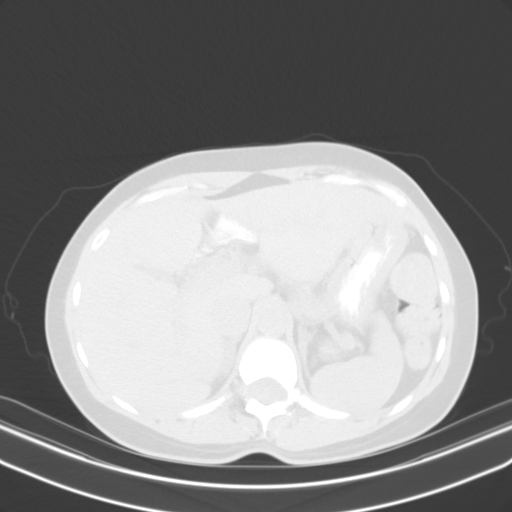
[im 79/93  soft-tissue]
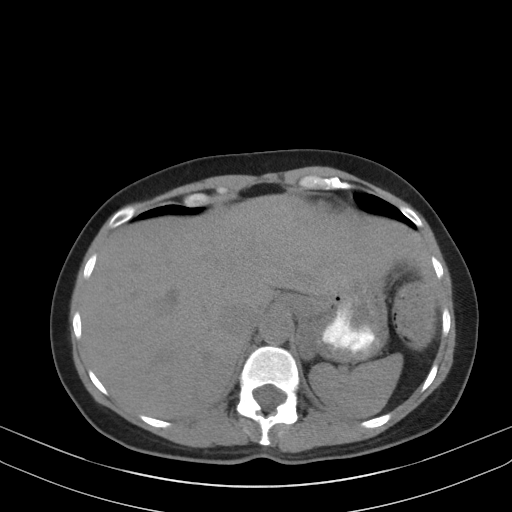
[im 79/93  lung]
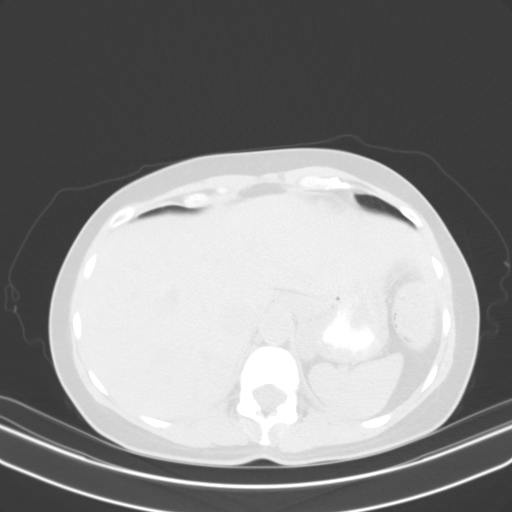
[im 84/93  lung]
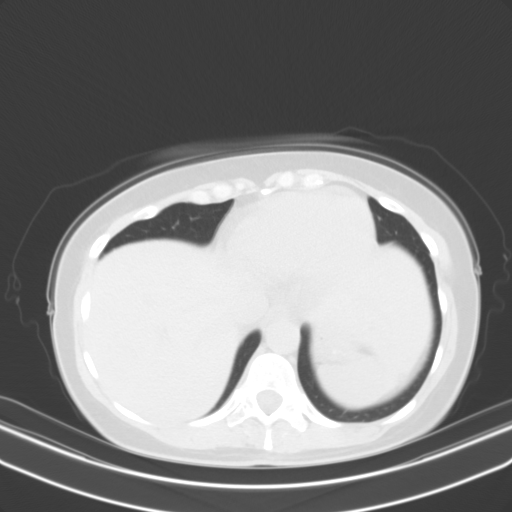
[im 88/93  soft-tissue]
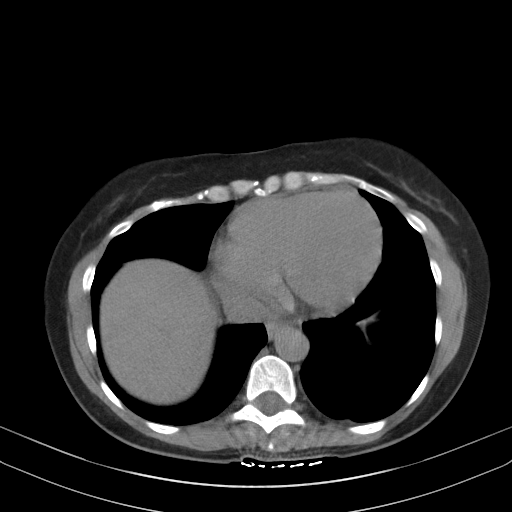
[im 88/93  lung]
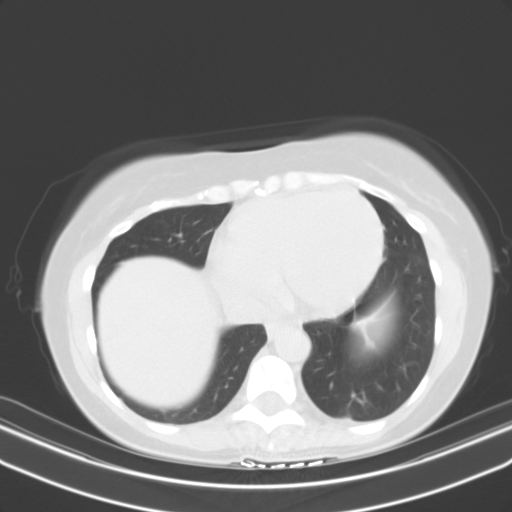

[14 of 32 positions shown; findings below may reference images not displayed]

FINDINGS: Lower chest: No acute pleural or parenchymal lung disease.

Hepatobiliary: Subcentimeter hypodensities within the right lobe
image 17 and left lobe image 23 likely reflect subcentimeter cysts.
Otherwise unremarkable unenhanced appearance of the liver and
gallbladder. No biliary duct dilation.

Pancreas: Unremarkable unenhanced appearance.

Spleen: Unremarkable unenhanced appearance.

Adrenals/Urinary Tract: Status post resection of the bilateral
native kidneys. There is a right lower quadrant transplant kidney. A
punctate calcification within the lower pole transplant kidney
measures less than 2 mm consistent with nonobstructing calculus.
Punctate calcifications at the hilum likely reflect vascular
calcifications. No evidence of obstructive uropathy.

Native adrenals are grossly stable. Bladder is moderately distended
with no gross abnormalities.

Stomach/Bowel: No bowel obstruction or ileus. No bowel wall
thickening or inflammatory change. Moderate retained stool
throughout the colon. The appendix, if still present, is not well
visualized.

Vascular/Lymphatic: No significant vascular findings are present. No
enlarged abdominal or pelvic lymph nodes.

Reproductive: Heterogeneous enlarged uterus consistent with multiple
fibroids. No adnexal masses.

Other: Trace pelvic free fluid likely physiologic. No free
intraperitoneal gas. No abdominal wall hernia.

Musculoskeletal: No acute or destructive bony lesions. Reconstructed
images demonstrate no additional findings.
IMPRESSION: 1. Punctate less than 2 mm nonobstructing calculus within the right
lower quadrant transplant kidney.
2. Moderate fecal retention throughout the colon consistent with
constipation. No bowel obstruction or ileus.
3. Enlarged heterogeneous uterus consistent with multiple fibroids.
4. Postsurgical changes from bilateral nephrectomies.
5. Trace pelvic free fluid, likely physiologic.

## 2022-11-22 ENCOUNTER — Other Ambulatory Visit: Payer: Self-pay | Admitting: Endocrinology

## 2022-11-22 DIAGNOSIS — E042 Nontoxic multinodular goiter: Secondary | ICD-10-CM

## 2022-12-10 ENCOUNTER — Other Ambulatory Visit: Payer: Self-pay | Admitting: Nephrology

## 2022-12-10 DIAGNOSIS — Z940 Kidney transplant status: Secondary | ICD-10-CM

## 2023-06-03 NOTE — Progress Notes (Signed)
 [facility].Trinna GORMAN Sprang, RN  Intake 64 oz or more UOP as much as taking in, with pressure on urination B/P 160's to 170's/70's  Concern *UTI Pressure on urination  Trinna GORMAN Sprang, RN

## 2023-07-19 ENCOUNTER — Encounter (HOSPITAL_COMMUNITY): Payer: Self-pay

## 2023-07-25 ENCOUNTER — Encounter (HOSPITAL_COMMUNITY): Payer: Self-pay

## 2023-07-26 ENCOUNTER — Ambulatory Visit
Admission: RE | Admit: 2023-07-26 | Discharge: 2023-07-26 | Disposition: A | Discharge status: Home / Self Care | Payer: No Typology Code available for payment source | Source: Ambulatory Visit | Attending: Nephrology | Admitting: Nephrology

## 2023-07-26 DIAGNOSIS — Z940 Kidney transplant status: Secondary | ICD-10-CM

## 2023-12-30 ENCOUNTER — Other Ambulatory Visit: Payer: Self-pay | Admitting: Nephrology

## 2023-12-30 DIAGNOSIS — Z940 Kidney transplant status: Secondary | ICD-10-CM

## 2024-01-06 ENCOUNTER — Ambulatory Visit
Admission: RE | Admit: 2024-01-06 | Discharge: 2024-01-06 | Disposition: A | Discharge status: Home / Self Care | Source: Ambulatory Visit | Attending: Nephrology | Admitting: Nephrology

## 2024-01-06 DIAGNOSIS — Z940 Kidney transplant status: Secondary | ICD-10-CM

## 2024-05-22 ENCOUNTER — Other Ambulatory Visit: Payer: Self-pay | Admitting: *Deleted

## 2024-05-22 DIAGNOSIS — N184 Chronic kidney disease, stage 4 (severe): Secondary | ICD-10-CM

## 2024-06-18 ENCOUNTER — Ambulatory Visit

## 2024-06-18 ENCOUNTER — Ambulatory Visit (HOSPITAL_COMMUNITY)
